# Patient Record
Sex: Female | Born: 1985 | Race: White | Hispanic: No | Smoking: Never smoker
Health system: Southern US, Community
[De-identification: ages and names within clinical notes are randomized; demographics above are authoritative.]

## PROBLEM LIST (undated history)

## (undated) DIAGNOSIS — E119 Type 2 diabetes mellitus without complications: Secondary | ICD-10-CM

## (undated) DIAGNOSIS — I10 Essential (primary) hypertension: Secondary | ICD-10-CM

## (undated) DIAGNOSIS — O139 Gestational [pregnancy-induced] hypertension without significant proteinuria, unspecified trimester: Secondary | ICD-10-CM

## (undated) HISTORY — PX: EYE SURGERY: SHX253

## (undated) HISTORY — PX: CERVICAL POLYPECTOMY: SHX88

---
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
## 2008-09-01 ENCOUNTER — Emergency Department (HOSPITAL_COMMUNITY): Admission: EM | Admit: 2008-09-01 | Discharge: 2008-09-01 | Payer: Self-pay | Admitting: Emergency Medicine

## 2010-12-06 LAB — WET PREP, GENITAL: Yeast Wet Prep HPF POC: NONE SEEN

## 2010-12-06 LAB — CBC
HCT: 40.4 % (ref 36.0–46.0)
Hemoglobin: 14.1 g/dL (ref 12.0–15.0)
MCHC: 34.9 g/dL (ref 30.0–36.0)
MCV: 93.8 fL (ref 78.0–100.0)
RBC: 4.31 MIL/uL (ref 3.87–5.11)

## 2010-12-06 LAB — DIFFERENTIAL
Basophils Relative: 0 % (ref 0–1)
Eosinophils Absolute: 0.2 10*3/uL (ref 0.0–0.7)
Eosinophils Relative: 2 % (ref 0–5)
Monocytes Absolute: 0.4 10*3/uL (ref 0.1–1.0)
Monocytes Relative: 5 % (ref 3–12)
Neutro Abs: 6.9 10*3/uL (ref 1.7–7.7)

## 2010-12-06 LAB — POCT I-STAT, CHEM 8
Chloride: 103 mEq/L (ref 96–112)
Glucose, Bld: 120 mg/dL — ABNORMAL HIGH (ref 70–99)
HCT: 43 % (ref 36.0–46.0)
Potassium: 4 mEq/L (ref 3.5–5.1)

## 2010-12-06 LAB — POCT PREGNANCY, URINE: Preg Test, Ur: POSITIVE

## 2010-12-06 LAB — URINALYSIS, ROUTINE W REFLEX MICROSCOPIC
Glucose, UA: NEGATIVE mg/dL
Hgb urine dipstick: NEGATIVE
Protein, ur: NEGATIVE mg/dL
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
## 2011-01-07 NOTE — Discharge Summary (Signed)
[facility]  Patient:    Natasha Khan, Natasha Khan                        MRN: [id number] Adm. Date:  [id number] Disc. Date: [id number] Attending:  Veneta Penton                           Discharge Summary  REASON FOR ADMISSION:  This 25 year old white female was admitted complaining of suicidal ideation with plan to overdose on aspirin.  For further history of present illness, please see the patients psychiatric admission assessment.  PHYSICAL EXAMINATION:  Her physical examination at the time of admission was significant for her being overweight and was otherwise remarkable for a history of exercise induced asthma.  LABORATORY EXAMINATION:  A urine drug screen was negative.  Urine probe for gonorrhea and chlamydia are pending at the time of discharge.  A urine drug screen was negative.  A urine pregnancy test was negative.  Urinalysis was unremarkable.  Hepatic panel was within normal limits.  Metabolic panel was within normal limits.  A CBC showed an MCHC of 35 and was otherwise unremarkable.  Thyroid function tests were within normal limits and an RPR was nonreactive.  The patient received no special procedures, x-rays or additional consultations and she sustained no complications during her hospital course.  HOSPITAL COURSE:  On admission, the patient as depressed, irritable, angry, hostile and suicidal.  She was continued on a trial of Prozac at 20 mg p.o. q.d. and tolerated this medication well without side effects.  Over the course of this hospitalization, she has shown significant improvement in her affect and mood.  For the past 24 hours she has denied any homicidal or suicidal ideation, has plans for the future, has been participating in all aspects of the therapeutic treatment program and reports feeling encouraged and ready to begin outpatient treatment.  Consequently, it is felt she has reached her maximum benefit of hospitalization and is  ready for discharge to a less restrictive alternative setting.  CONDITION ON DISCHARGE:  Improved.  DSM-IV DISCHARGE DIAGNOSES: Axis I:    1. Major depression, recurrent type severe without psychosis.            2. Cannabis abuse.            3. Oppositional defiant disorder.            4. Rule out post-traumatic stress disorder. Axis II:   None. Axis III:  Allergic rhinitis. Axis IV:   Current psychosocial stressors are severe. Axis V:    Code 10 on admission, code 30 on discharge.  FURTHER EVALUATION AND TREATMENT RECOMMENDATIONS: 1. The patient is discharged to home. 2. The patient is discharged on Prozac 20 mg p.o. q.d., Allegra as instructed    by her pediatrician for allergic rhinitis. 3. The patient is discharged on an unrestricted level of activity and a    regular diet. 4. She is discharged to the custody of her parents. 5. She will follow up at [facility] with her outpatient    therapist for individual and family therapy as well as following up with    the psychiatrist there for medication management.  As she will follow up    with her outpatient mental health professionals at [facility] for all further aspects of her mental health care, I will sign off  on the case at this time. DD:  06/30/00 TD:  06/30/00 Job: [id number]

## 2015-11-27 ENCOUNTER — Other Ambulatory Visit: Payer: Self-pay | Admitting: Family Medicine

## 2015-11-27 DIAGNOSIS — R102 Pelvic and perineal pain: Secondary | ICD-10-CM

## 2015-12-09 ENCOUNTER — Ambulatory Visit
Admission: RE | Admit: 2015-12-09 | Discharge: 2015-12-09 | Disposition: A | Discharge status: Home / Self Care | Payer: BLUE CROSS/BLUE SHIELD | Source: Ambulatory Visit | Attending: Family Medicine | Admitting: Family Medicine

## 2015-12-09 DIAGNOSIS — R102 Pelvic and perineal pain: Secondary | ICD-10-CM

## 2018-01-10 ENCOUNTER — Telehealth: Payer: Self-pay | Admitting: Hematology and Oncology

## 2018-01-10 ENCOUNTER — Encounter: Payer: Self-pay | Admitting: Hematology and Oncology

## 2018-01-10 NOTE — Telephone Encounter (Signed)
Referral received from Dr. Cherly Hensen at [facility] for dx of positive anticardiolipin antibody. Pt has been scheduled to see DR. Gudena on 6/18 at 345pm because she needed the latest hematology appt since she works in [place]. Pt aware to arrive 30 minutes early. Letter mailed.

## 2018-02-06 ENCOUNTER — Inpatient Hospital Stay: Payer: BLUE CROSS/BLUE SHIELD | Attending: Hematology and Oncology | Admitting: Hematology and Oncology

## 2018-02-06 DIAGNOSIS — E7212 Methylenetetrahydrofolate reductase deficiency: Secondary | ICD-10-CM | POA: Diagnosis not present

## 2018-02-06 DIAGNOSIS — Z1589 Genetic susceptibility to other disease: Secondary | ICD-10-CM

## 2018-02-06 NOTE — Assessment & Plan Note (Signed)
Patient has had multiple pregnancy losses. OB/GYN performed extensive work-up for thrombophilia. 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  Counseling: I discussed with the patient how empty HFR gene converts homocystine into methionine.  Lack of this enzyme could lead to hyper homocystinemia which can subsequently raise the risk of thromboembolic disease and pregnancy losses. However multiple studies have shown that heterozygous MTHFR gene mutation does not appear to increase hyper homocystinemia and therefore is not considered to be a thrombophilia. In fact 30% of the population can have a heterozygous mutation.  Multiple studies including a meta-analysis published in 2006 suggested that heterozygous MTHFR gene mutation has not been linked to a pregnancy related complications which included preeclampsia or pregnancy losses or spina bifida.  Based on this finding on that study, I do not recommend any special interventions and I do not believe the MTHFR gene mutation is the cause of patient's pregnancy losses. On the remainder of the blood work there was only a slight abnormality of an indeterminate level of elevation of lupus anticoagulant.  The functional assay for lupus anticoagulant was negative therefore there is no concern for antiphospholipid antibody syndrome.  There is no intervention that is necessary regarding this aspect.  In summary I do not believe her blood work explains the cause of her recurrent pregnancy losses. I instructed the patient to call us if she has any other questions or concerns. Thank you very much for allowing us to participate in her care.

## 2018-02-06 NOTE — Progress Notes (Signed)
[facility]  Patient Care Team: Richmond CampbellKaplan, Kristen W., PA-C as PCP - General (Family Medicine)  CHIEF COMPLAINTS/PURPOSE OF CONSULTATION:  MTHFR gene mutation  HISTORY OF PRESENTING ILLNESS:  Natasha MuttonRachael Khan 32 y.o. 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  She had prior history of multiple pregnancy losses and as part of the work-up she had hypercoagulability testing performed.  She was referred to us because they were a couple of abnormalities that raised concerns.  This included a heterozygous 677C MTHFR gene mutation as well as an indeterminate finding on the lupus anticoagulant testing.  I reviewed her records extensively and collaborated the history with the patient.  MEDICAL HISTORY:  Multiple pregnancy losses SURGICAL HISTORY: No prior surgeries SOCIAL HISTORY: Denies any tobacco alcohol or recreational drug use FAMILY HISTORY: No family history of cancers ALLERGIES:  has no allergies on file.  MEDICATIONS: Prenatal vitamins REVIEW OF SYSTEMS:   Constitutional: Denies fevers, chills or abnormal night sweats Eyes: Denies blurriness of vision, double vision or watery eyes Ears, nose, mouth, throat, and face: Denies mucositis or sore throat Respiratory: Denies cough, dyspnea or wheezes Cardiovascular: Denies palpitation, chest discomfort or lower extremity swelling Gastrointestinal:  Denies nausea, heartburn or change in bowel habits Skin: Denies abnormal skin rashes Lymphatics: Denies new lymphadenopathy or easy bruising Neurological:Denies numbness, tingling or new weaknesses Behavioral/Psych: Mood is stable, no new changes  All other systems were reviewed with the patient and are negative.  PHYSICAL EXAMINATION: ECOG PERFORMANCE STATUS: 0 - Asymptomatic  Vitals:   02/06/18 1535  BP: 120/78  Pulse: 79  Resp: 20  Temp: 98.5 F (36.9 C)  SpO2: 98%   Filed Weights   02/06/18 1535  Weight: 178 lb  11.2 oz (81.1 kg)    GENERAL:alert, no distress and comfortable SKIN: skin color, texture, turgor are normal, no rashes or significant lesions EYES: normal, conjunctiva are pink and non-injected, sclera clear OROPHARYNX:no exudate, no erythema and lips, buccal mucosa, and tongue normal  NECK: supple, thyroid normal size, non-tender, without nodularity LYMPH:  no palpable lymphadenopathy in the cervical, axillary or inguinal LUNGS: clear to auscultation and percussion with normal breathing effort HEART: regular rate & rhythm and no murmurs and no lower extremity edema ABDOMEN:abdomen soft, non-tender and normal bowel sounds Musculoskeletal:no cyanosis of digits and no clubbing  PSYCH: alert & oriented x 3 with fluent speech NEURO: no focal motor/sensory deficits  LABORATORY DATA:  I have reviewed the data as listed Lab Results  Component Value Date   WBC 9.4 09/01/2008   HGB 14.6 09/01/2008   HCT 43.0 09/01/2008   MCV 93.8 09/01/2008   PLT 259 09/01/2008   Lab Results  Component Value Date   NA 141 09/01/2008   K 4.0 09/01/2008   CL 103 09/01/2008    RADIOGRAPHIC STUDIES: I have personally reviewed the radiological reports and agreed with the findings in the report.  ASSESSMENT AND PLAN:  Heterozygous for MTHFR gene mutation [facility](HCC) Patient has had multiple pregnancy losses. OB/GYN performed extensive work-up for thrombophilia. 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  Counseling: I discussed with the patient how empty HFR gene converts homocystine into methionine.  Lack of this enzyme could lead to hyper homocystinemia which can subsequently raise the risk of thromboembolic disease and pregnancy losses. However multiple studies have shown that heterozygous MTHFR gene mutation does not appear to increase hyper homocystinemia and therefore is not considered to  be a thrombophilia. In fact 30% of the population can have a heterozygous  mutation.  Multiple studies including a meta-analysis published in 2006 suggested that heterozygous MTHFR gene mutation has not been linked to a pregnancy related complications which included preeclampsia or pregnancy losses or spina bifida.  Recommendation: Based on this finding on that study, I do not recommend any special interventions and I do not believe the MTHFR gene mutation is the cause of patient's pregnancy losses.  On the remainder of the blood work there was only a slight abnormality of an indeterminate level of elevation of lupus anticoagulant.  The functional assay for lupus anticoagulant was negative therefore there is no concern for antiphospholipid antibody syndrome. There is no intervention that is necessary regarding this aspect.  In summary I do not believe her blood work explains the cause of her recurrent pregnancy losses. I instructed the patient to call us if she has any other questions or concerns. Thank you very much for allowing [place] to participate in her care.   All questions were answered. The patient knows to call the clinic with any problems, questions or concerns.    Tamsen Meek, MD 02/06/18

## 2020-08-22 NOTE — L&D Delivery Note (Signed)
Delivery Note At 9:51 PM a viable female was delivered via Vaginal, Spontaneous (Presentation:   Occiput Anterior).  APGAR: 7, 9; weight  .pending   Placenta status: Spontaneous, Intact.  Cord: 3 vessels with the following complications: None.  Cord pH: 7.2  During pushing FH to 60-70, pt encouraged to push over 2 contractions and delivery of head, nuchal x 1 reduced, shoulders followed easily. Cord clamped and cut at about 30 seconds for poor tone, FH, no cry. NICU in attendance and baby with cry at warmer. Cord pH sent.   Anesthesia: Epidural Episiotomy: None Lacerations: 1st degree R anterior/ sulcal ;2nd degree L inferior sulcal Suture Repair: 3.0 vicryl rapide Est. Blood Loss (mL): 250  Mom to postpartum.  Baby to Couplet care / Skin to Skin.  Lendon Colonel 07/27/2021, 10:30 PM

## 2021-01-12 LAB — OB RESULTS CONSOLE ABO/RH: RH Type: POSITIVE

## 2021-01-12 LAB — OB RESULTS CONSOLE GC/CHLAMYDIA
Chlamydia: NEGATIVE
Gonorrhea: NEGATIVE

## 2021-01-12 LAB — OB RESULTS CONSOLE RPR: RPR: NONREACTIVE

## 2021-01-12 LAB — OB RESULTS CONSOLE HIV ANTIBODY (ROUTINE TESTING): HIV: NONREACTIVE

## 2021-01-12 LAB — OB RESULTS CONSOLE HEPATITIS B SURFACE ANTIGEN: Hepatitis B Surface Ag: NEGATIVE

## 2021-01-12 LAB — OB RESULTS CONSOLE RUBELLA ANTIBODY, IGM: Rubella: NON-IMMUNE/NOT IMMUNE

## 2021-01-12 LAB — OB RESULTS CONSOLE ANTIBODY SCREEN: Antibody Screen: NEGATIVE

## 2021-02-04 ENCOUNTER — Other Ambulatory Visit: Payer: Self-pay | Admitting: Obstetrics and Gynecology

## 2021-02-04 DIAGNOSIS — N611 Abscess of the breast and nipple: Secondary | ICD-10-CM

## 2021-02-05 ENCOUNTER — Ambulatory Visit
Admission: RE | Admit: 2021-02-05 | Discharge: 2021-02-05 | Disposition: A | Discharge status: Home / Self Care | Payer: BC Managed Care – PPO | Source: Ambulatory Visit | Attending: Obstetrics and Gynecology | Admitting: Obstetrics and Gynecology

## 2021-02-05 ENCOUNTER — Ambulatory Visit
Admission: RE | Admit: 2021-02-05 | Discharge: 2021-02-05 | Disposition: A | Discharge status: Home / Self Care | Payer: BLUE CROSS/BLUE SHIELD | Source: Ambulatory Visit | Attending: Obstetrics and Gynecology | Admitting: Obstetrics and Gynecology

## 2021-02-05 ENCOUNTER — Other Ambulatory Visit: Payer: Self-pay

## 2021-02-05 DIAGNOSIS — N611 Abscess of the breast and nipple: Secondary | ICD-10-CM

## 2021-04-07 ENCOUNTER — Telehealth: Payer: Self-pay | Admitting: Hematology and Oncology

## 2021-04-07 NOTE — Telephone Encounter (Signed)
Received a new hem referral from Dr. Ernestina Penna for Methylenetetrahydrofolate reductase deficiency. Natasha Khan has been cld and scheduled to see Dr. Pamelia Hoit on 8/30 at 345pm. Pt aware to arrive 15 minutes early.

## 2021-04-19 NOTE — Progress Notes (Signed)
[facility] CONSULT NOTE  Patient Care Team: Richmond Campbell., PA-C as PCP - General (Family Medicine)  CHIEF COMPLAINTS/PURPOSE OF CONSULTATION:  Newly diagnosed methylenetetrahydrofolate reductase deficiency  HISTORY OF PRESENTING ILLNESS:  Natasha Khan 35 y.o. female is here because of recent diagnosis of methylenetetrahydrofolate reductase deficiency. She presents to the clinic today for initial evaluation and discussion of treatment options.  She is 6 months pregnant and is very concerned about pregnancy losses because she had 6 pregnancy losses and has extensive family history of blood clots.  She was started on heparin by her high risk OB and there was a disagreement between whether or not she needs anticoagulation or not and therefore we were asked to consult.  I reviewed her records extensively and collaborated the history with the patient.  MEDICAL HISTORY:  Multiple pregnancy losses  SURGICAL HISTORY: No prior surgeries  SOCIAL HISTORY: Denies any tobacco or alcohol recreational drug use  FAMILY HISTORY: Extensive family members with blood clots and thromboembolic events    ALLERGIES:  has no allergies on file.  MEDICATIONS:  Current Outpatient Medications  Medication Sig Dispense Refill   busPIRone (BUSPAR) 7.5 MG tablet Take 1 tablet (7.5 mg total) by mouth 2 (two) times daily.     enoxaparin (LOVENOX) 150 MG/ML injection Inject 1 mL (150 mg total) into the skin daily. 30 mL 3   B Complex-C-Folic Acid (HM SUPER VITAMIN B COMPLEX/C PO) Take by mouth.     labetalol (NORMODYNE) 300 MG tablet Take 1 tablet (300 mg total) by mouth 3 (three) times daily.     loratadine (CLARITIN) 10 MG tablet Take 10 mg by mouth daily.     Prenatal Vit-Fe Fumarate-FA (MULTIVITAMIN-PRENATAL) 27-0.8 MG TABS tablet Take 1 tablet by mouth daily at 12 noon.     No current facility-administered medications for this visit.    REVIEW OF SYSTEMS:   Constitutional: Denies  fevers, chills or abnormal night sweats Pleasant lady no acute distress All other systems were reviewed with the patient and are negative.  PHYSICAL EXAMINATION: ECOG PERFORMANCE STATUS: 1 - Symptomatic but completely ambulatory  Vitals:   04/20/21 1548  BP: (!) 131/57  Pulse: 83  Resp: 16  Temp: (!) 97.5 F (36.4 C)  SpO2: 95%   Filed Weights   04/20/21 1548  Weight: 232 lb 3.2 oz (105.3 kg)       LABORATORY DATA:  I have reviewed the data as listed Lab Results  Component Value Date   WBC 9.4 09/01/2008   HGB 14.6 09/01/2008   HCT 43.0 09/01/2008   MCV 93.8 09/01/2008   PLT 259 09/01/2008   Lab Results  Component Value Date   NA 141 09/01/2008   K 4.0 09/01/2008   CL 103 09/01/2008    RADIOGRAPHIC STUDIES: I have personally reviewed the radiological reports and agreed with the findings in the report.  ASSESSMENT AND PLAN:  Heterozygous for MTHFR gene mutation (HCC) I discussed with the patient that the MTHFR gene mutation leads to hyper homocystinemia which can increase the risk of blood clots. MTHFR gene helps in the size of protein that enables conversion of homocysteine to methionine. This enzyme reaction is catalyzed in the presence of B complex vitamins and folic acid. In the [place], the percent of the population is heterozygous for this gene and 10% of homozygous. Only the homozygous variant carrying patients have hyper homocystinemia.  Heterozygous MTHFR gene mutation without associated hyper homocystinemia is not a risk factor  for thrombosis and usually does not require any anticoagulation.  However given the patient's history of 6 pregnancy losses and extensive family history of thromboembolic disease, there appears to be some factor that may be interfering with the coagulation pathway and therefore I recommend anticoagulation with Lovenox.  Plan:  1. Lovenox 1.5 mg/kg once daily (150 mg total dose) 2. Anti-X A level to be drawn 4 hours after  the injection in 1 week Telephone visit after that to discuss adjustment of the dose 3.  During the last week of pregnancy, she will switch back to heparin. 4.  Postpartum she will take Lovenox for 6 more weeks.   PAI deficiency: Plasminogen activator inhibitor-1 deficiency leads to excessive fibrinolysis with the potential for bleeding as a complication. Heterozygous individuals carrying a single gene do not exhibit abnormal bleeding Homozygous individuals can experience more bleeding after surgery or trauma.  Obstetric complications including preterm labor and miscarriage can also happen. If patients have excessive bleeding then, tranexamic acid or aminocaproic acid can be given postoperatively   All questions were answered. The patient knows to call the clinic with any problems, questions or concerns.   Sabas Sous, MD, MPH 04/20/2021    I, Alda Ponder, am acting as scribe for Serena Croissant, MD.  I have reviewed the above documentation for accuracy and completeness, and I agree with the above.

## 2021-04-20 ENCOUNTER — Other Ambulatory Visit: Payer: Self-pay

## 2021-04-20 ENCOUNTER — Inpatient Hospital Stay: Payer: BC Managed Care – PPO | Attending: Hematology and Oncology | Admitting: Hematology and Oncology

## 2021-04-20 DIAGNOSIS — Z7901 Long term (current) use of anticoagulants: Secondary | ICD-10-CM | POA: Diagnosis not present

## 2021-04-20 DIAGNOSIS — Z79899 Other long term (current) drug therapy: Secondary | ICD-10-CM | POA: Insufficient documentation

## 2021-04-20 DIAGNOSIS — E7212 Methylenetetrahydrofolate reductase deficiency: Secondary | ICD-10-CM | POA: Diagnosis not present

## 2021-04-20 DIAGNOSIS — Z1589 Genetic susceptibility to other disease: Secondary | ICD-10-CM

## 2021-04-20 MED ORDER — BUSPIRONE HCL 7.5 MG PO TABS: 7.5000 mg | ORAL_TABLET | Freq: Two times a day (BID) | ORAL | Status: AC

## 2021-04-20 MED ORDER — LABETALOL HCL 300 MG PO TABS: 300.0000 mg | ORAL_TABLET | Freq: Three times a day (TID) | ORAL | Status: DC

## 2021-04-20 MED ORDER — ENOXAPARIN SODIUM 150 MG/ML IJ SOSY: 150.0000 mg | PREFILLED_SYRINGE | INTRAMUSCULAR | 3 refills | Status: DC

## 2021-04-20 NOTE — Assessment & Plan Note (Addendum)
I discussed with the patient that the MTHFR gene mutation leads to hyper homocystinemia which can increase the risk of blood clots. MTHFR gene helps in the size of protein that enables conversion of homocysteine to methionine. This enzyme reaction is catalyzed in the presence of B complex vitamins and folic acid. In the [place], the percent of the population is heterozygous for this gene and 10% of homozygous. Only the homozygous variant carrying patients have hyper homocystinemia.  Heterozygous MTHFR gene mutation without associated hyper homocystinemia is not a risk factor for thrombosis and usually does not require any anticoagulation.  However given the patient's history of 6 pregnancy losses and extensive family history of thromboembolic disease, there appears to be some factor that may be interfering with the coagulation pathway and therefore I recommend anticoagulation with Lovenox.  Plan:  1. Lovenox 1.5 mg/kg once daily (150 mg total dose) 2. Anti-X A level to be drawn 4 hours after the injection in 1 week Telephone visit after that to discuss adjustment of the dose 3.  During the last week of pregnancy, she will switch back to heparin. 4.  Postpartum she will take Lovenox for 6 more weeks.   PAI deficiency: Plasminogen activator inhibitor-1 deficiency leads to excessive fibrinolysis with the potential for bleeding as a complication. Heterozygous individuals carrying a single gene do not exhibit abnormal bleeding Homozygous individuals can experience more bleeding after surgery or trauma.  Obstetric complications including preterm labor and miscarriage can also happen. If patients have excessive bleeding then, tranexamic acid or aminocaproic acid can be given postoperatively

## 2021-04-27 ENCOUNTER — Inpatient Hospital Stay: Payer: BC Managed Care – PPO | Attending: Hematology and Oncology

## 2021-04-27 ENCOUNTER — Other Ambulatory Visit: Payer: Self-pay

## 2021-04-27 DIAGNOSIS — Z1589 Genetic susceptibility to other disease: Secondary | ICD-10-CM

## 2021-04-27 DIAGNOSIS — E7212 Methylenetetrahydrofolate reductase deficiency: Secondary | ICD-10-CM | POA: Diagnosis not present

## 2021-04-27 LAB — HEPARIN ANTI-XA: Heparin LMW: 0.74 IU/mL

## 2021-04-27 NOTE — Progress Notes (Signed)
  HEMATOLOGY-ONCOLOGY TELEPHONE VISIT PROGRESS NOTE  I connected with Natasha Khan on 04/28/2021 at 10:45 AM EDT by telephone and verified that I am speaking with the correct person using two identifiers.  I discussed the limitations, risks, security and privacy concerns of performing an evaluation and management service by telephone and the availability of in person appointments.  I also discussed with the patient that there may be a patient responsible charge related to this service. The patient expressed understanding and agreed to proceed.   History of Present Illness: Natasha Khan is a 35 y.o. 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 She presents via telephone today for follow-up.  She has been tolerating the injections reasonably well.  She does cause injection site discomfort with slight irritation and itching but she has been able to manage it.   Observations/Objective:     Assessment Plan:  Heterozygous for MTHFR gene mutation (HCC) Heterozygous MTHFR gene mutation without associated hyper homocystinemia is not a risk factor for thrombosis and usually does not require any anticoagulation.   However given the patient's history of 6 pregnancy losses and extensive family history of thromboembolic disease, there appears to be some factor that may be interfering with the coagulation pathway and therefore I recommend anticoagulation with Lovenox.   Plan:  1. Lovenox 1.5 mg/kg once daily (150 mg total dose) 2. Anti-X A level  3.  During the last week of pregnancy, she will switch back to heparin. 4.  Postpartum she will take Lovenox for 6 more weeks.  Lab review: Antiten X A level: 0.74: This is below the threshold for adequate anticoagulation.  Ideally we would like to increase the dosage to 180 mg but there is no 180 mg syringe available.  Since its only been a week on the treatment we decided to watch it another week and then make a  decision.  The other issue is also the cost of the drug.  It is about $10 a vial and there are no other options for her.   I discussed the assessment and treatment plan with the patient. The patient was provided an opportunity to ask questions and all were answered. The patient agreed with the plan and demonstrated an understanding of the instructions. The patient was advised to call back or seek an in-person evaluation if the symptoms worsen or if the condition fails to improve as anticipated.   Total time spent: 12 mins including non-face to face time and time spent for planning, charting and coordination of care  Sabas Sous, MD 04/28/2021    I, Alda Ponder, am acting as scribe for Serena Croissant, MD.  I have reviewed the above documentation for accuracy and completeness, and I agree with the above.

## 2021-04-28 ENCOUNTER — Inpatient Hospital Stay (HOSPITAL_BASED_OUTPATIENT_CLINIC_OR_DEPARTMENT_OTHER): Payer: BC Managed Care – PPO | Admitting: Hematology and Oncology

## 2021-04-28 DIAGNOSIS — E7212 Methylenetetrahydrofolate reductase deficiency: Secondary | ICD-10-CM

## 2021-04-28 DIAGNOSIS — Z1589 Genetic susceptibility to other disease: Secondary | ICD-10-CM | POA: Diagnosis not present

## 2021-04-28 NOTE — Assessment & Plan Note (Signed)
Heterozygous MTHFR gene mutation without associated hyper homocystinemia is not a risk factor for thrombosis and usually does not require any anticoagulation.  However given the patient's history of 6 pregnancy losses and extensive family history of thromboembolic disease, there appears to be some factor that may be interfering with the coagulation pathway and therefore I recommend anticoagulation with Lovenox.  Plan:  1. Lovenox 1.5 mg/kg once daily (150 mg total dose) 2. Anti-X A level  3.  During the last week of pregnancy, she will switch back to heparin. 4.  Postpartum she will take Lovenox for 6 more weeks.

## 2021-04-30 ENCOUNTER — Telehealth: Payer: Self-pay | Admitting: Hematology and Oncology

## 2021-04-30 NOTE — Telephone Encounter (Signed)
Scheduled appt per 9/7 los - patient is aware of appt date and time.  

## 2021-05-05 ENCOUNTER — Other Ambulatory Visit: Payer: Self-pay

## 2021-05-05 ENCOUNTER — Inpatient Hospital Stay: Payer: BC Managed Care – PPO

## 2021-05-05 DIAGNOSIS — Z1589 Genetic susceptibility to other disease: Secondary | ICD-10-CM

## 2021-05-05 DIAGNOSIS — E7212 Methylenetetrahydrofolate reductase deficiency: Secondary | ICD-10-CM | POA: Diagnosis not present

## 2021-05-05 LAB — HEPARIN ANTI-XA: Heparin LMW: 0.61 IU/mL

## 2021-05-05 NOTE — Progress Notes (Signed)
HEMATOLOGY-ONCOLOGY TELEPHONE VISIT PROGRESS NOTE  I connected with Natasha Khan on 05/06/21 at  8:45 AM EDT by telephone and verified that I am speaking with the correct person using two identifiers.  I discussed the limitations, risks, security and privacy concerns of performing an evaluation and management service by telephone and the availability of in person appointments.  I also discussed with the patient that there may be a patient responsible charge related to this service. The patient expressed understanding and agreed to proceed.   History of Present Illness: Follow-up to discuss anticoagulation plan Natasha Khan is currently [redacted] weeks pregnant and had a prior history of pregnancy losses and extensive family history of thromboembolic disease.  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  She was started on therapeutic Lovenox dosing with the concern for thromboembolic disease but today we are discussing whether that approach is necessary.   Assessment Plan:  Heterozygous for MTHFR gene mutation (HCC) Heterozygous MTHFR gene mutation without associated hyper homocystinemia is not a risk factor for thrombosis and usually does not require any anticoagulation.   However given the patient's history of 6 pregnancy losses and extensive family history of thromboembolic disease, there appears to be some factor that may be interfering with the coagulation pathway and therefore I recommend anticoagulation with Lovenox. Current: 27 weeks      Lab review:  Antiten X A level:   04/27/2021: 0.74 05/05/2021: 0.61  Counseling: We had lengthy discussion about the pros and cons of therapeutic Lovenox versus prophylactic Lovenox dosing. Given the fact that she has both MTHFR gene mutation as well as the PAI-1 inhibitor deficiency, it is reasonable to go to the prophylactic dosage of Lovenox instead. She will discuss this with Dr. Algie Coffer. The prophylactic dose  will be 40 mg subcu daily. During the last 2 weeks of pregnancy she will need to switch to heparin subcu 5000 international units Postpartum for 6 weeks she will need to stay on Lovenox once her bleeding is controlled.  Patient asked me to send a message Dr. Algie Coffer to prescribe the Lovenox. We will see her on an as-needed basis.     I discussed the assessment and treatment plan with the patient. The patient was provided an opportunity to ask questions and all were answered. The patient agreed with the plan and demonstrated an understanding of the instructions. The patient was advised to call back or seek an in-person evaluation if the symptoms worsen or if the condition fails to improve as anticipated.   I provided 12 minutes of non-face-to-face time during this encounter. Tamsen Meek, MD

## 2021-05-06 ENCOUNTER — Ambulatory Visit (HOSPITAL_BASED_OUTPATIENT_CLINIC_OR_DEPARTMENT_OTHER): Payer: BC Managed Care – PPO | Admitting: Hematology and Oncology

## 2021-05-06 DIAGNOSIS — E7212 Methylenetetrahydrofolate reductase deficiency: Secondary | ICD-10-CM | POA: Diagnosis not present

## 2021-05-06 DIAGNOSIS — Z1589 Genetic susceptibility to other disease: Secondary | ICD-10-CM

## 2021-05-06 NOTE — Assessment & Plan Note (Addendum)
Heterozygous MTHFR gene mutation without associated hyper homocystinemia is not a risk factor for thrombosis andusuallydoes not require any anticoagulation.  However given the patient's history of 6 pregnancy lossesand extensive family history of thromboembolic disease,there appears to be some factor that may be interfering with the coagulation pathway and therefore I recommend anticoagulation with Lovenox. Current: 27 weeks    Lab review:  Antiten X A level:   04/27/2021: 0.74 05/05/2021: 0.61  Counseling: We had lengthy discussion about the pros and cons of therapeutic Lovenox versus prophylactic Lovenox dosing. Given the fact that she has both MTHFR gene mutation as well as the PAI-1 inhibitor deficiency, it is reasonable to go to the prophylactic dosage of Lovenox instead. She will discuss this with Dr. Algie Coffer. The prophylactic dose will be 40 mg subcu daily. During the last 2 weeks of pregnancy she will need to switch to heparin subcu 5000 international units Postpartum for 6 weeks she will need to stay on Lovenox once her bleeding is controlled.  Patient asked me to send a message Dr. Algie Coffer to prescribe the Lovenox. We will see her on an as-needed basis.

## 2021-05-14 ENCOUNTER — Encounter: Payer: Self-pay | Admitting: Hematology and Oncology

## 2021-07-12 LAB — OB RESULTS CONSOLE GBS: GBS: NEGATIVE

## 2021-07-23 ENCOUNTER — Other Ambulatory Visit: Payer: Self-pay | Admitting: Obstetrics

## 2021-07-26 ENCOUNTER — Other Ambulatory Visit: Payer: Self-pay

## 2021-07-27 ENCOUNTER — Inpatient Hospital Stay (HOSPITAL_COMMUNITY): Payer: BC Managed Care – PPO | Admitting: Anesthesiology

## 2021-07-27 ENCOUNTER — Encounter (HOSPITAL_COMMUNITY): Payer: Self-pay | Admitting: Obstetrics

## 2021-07-27 ENCOUNTER — Inpatient Hospital Stay (HOSPITAL_COMMUNITY): Payer: BC Managed Care – PPO

## 2021-07-27 ENCOUNTER — Inpatient Hospital Stay (HOSPITAL_COMMUNITY)
Admission: AD | Admit: 2021-07-27 | Discharge: 2021-07-29 | DRG: 806 | Disposition: A | Discharge status: Home / Self Care | Payer: BC Managed Care – PPO | Attending: Obstetrics | Admitting: Obstetrics

## 2021-07-27 DIAGNOSIS — O99284 Endocrine, nutritional and metabolic diseases complicating childbirth: Secondary | ICD-10-CM | POA: Diagnosis present

## 2021-07-27 DIAGNOSIS — O24424 Gestational diabetes mellitus in childbirth, insulin controlled: Secondary | ICD-10-CM | POA: Diagnosis present

## 2021-07-27 DIAGNOSIS — O1002 Pre-existing essential hypertension complicating childbirth: Secondary | ICD-10-CM | POA: Diagnosis present

## 2021-07-27 DIAGNOSIS — O99344 Other mental disorders complicating childbirth: Secondary | ICD-10-CM | POA: Diagnosis present

## 2021-07-27 DIAGNOSIS — Z3A38 38 weeks gestation of pregnancy: Secondary | ICD-10-CM

## 2021-07-27 DIAGNOSIS — Z3490 Encounter for supervision of normal pregnancy, unspecified, unspecified trimester: Secondary | ICD-10-CM | POA: Diagnosis present

## 2021-07-27 DIAGNOSIS — O99214 Obesity complicating childbirth: Secondary | ICD-10-CM | POA: Diagnosis present

## 2021-07-27 DIAGNOSIS — Q059 Spina bifida, unspecified: Secondary | ICD-10-CM

## 2021-07-27 DIAGNOSIS — Z20822 Contact with and (suspected) exposure to covid-19: Secondary | ICD-10-CM | POA: Diagnosis present

## 2021-07-27 DIAGNOSIS — F419 Anxiety disorder, unspecified: Secondary | ICD-10-CM | POA: Diagnosis present

## 2021-07-27 DIAGNOSIS — E7212 Methylenetetrahydrofolate reductase deficiency: Secondary | ICD-10-CM | POA: Diagnosis present

## 2021-07-27 DIAGNOSIS — O24419 Gestational diabetes mellitus in pregnancy, unspecified control: Secondary | ICD-10-CM

## 2021-07-27 DIAGNOSIS — O10919 Unspecified pre-existing hypertension complicating pregnancy, unspecified trimester: Secondary | ICD-10-CM | POA: Diagnosis present

## 2021-07-27 DIAGNOSIS — Z1589 Genetic susceptibility to other disease: Secondary | ICD-10-CM

## 2021-07-27 DIAGNOSIS — O99354 Diseases of the nervous system complicating childbirth: Secondary | ICD-10-CM | POA: Diagnosis present

## 2021-07-27 HISTORY — DX: Essential (primary) hypertension: I10

## 2021-07-27 HISTORY — DX: Gestational (pregnancy-induced) hypertension without significant proteinuria, unspecified trimester: O13.9

## 2021-07-27 HISTORY — DX: Type 2 diabetes mellitus without complications: E11.9

## 2021-07-27 LAB — CBC
HCT: 34.8 % — ABNORMAL LOW (ref 36.0–46.0)
HCT: 36.7 % (ref 36.0–46.0)
Hemoglobin: 11.6 g/dL — ABNORMAL LOW (ref 12.0–15.0)
Hemoglobin: 12.1 g/dL (ref 12.0–15.0)
MCH: 31.2 pg (ref 26.0–34.0)
MCH: 30.6 pg (ref 26.0–34.0)
MCHC: 33.3 g/dL (ref 30.0–36.0)
MCHC: 33 g/dL (ref 30.0–36.0)
MCV: 93.5 fL (ref 80.0–100.0)
MCV: 92.9 fL (ref 80.0–100.0)
Platelets: 363 10*3/uL (ref 150–400)
Platelets: 365 10*3/uL (ref 150–400)
RBC: 3.72 MIL/uL — ABNORMAL LOW (ref 3.87–5.11)
RBC: 3.95 MIL/uL (ref 3.87–5.11)
RDW: 13.4 % (ref 11.5–15.5)
RDW: 13.2 % (ref 11.5–15.5)
WBC: 10.2 10*3/uL (ref 4.0–10.5)
WBC: 17.3 10*3/uL — ABNORMAL HIGH (ref 4.0–10.5)
nRBC: 0 % (ref 0.0–0.2)
nRBC: 0 % (ref 0.0–0.2)

## 2021-07-27 LAB — COMPREHENSIVE METABOLIC PANEL
ALT: 20 U/L (ref 0–44)
AST: 31 U/L (ref 15–41)
Albumin: 2.8 g/dL — ABNORMAL LOW (ref 3.5–5.0)
Alkaline Phosphatase: 72 U/L (ref 38–126)
Anion gap: 8 (ref 5–15)
BUN: 8 mg/dL (ref 6–20)
CO2: 20 mmol/L — ABNORMAL LOW (ref 22–32)
Calcium: 8.9 mg/dL (ref 8.9–10.3)
Chloride: 105 mmol/L (ref 98–111)
Creatinine, Ser: 0.59 mg/dL (ref 0.44–1.00)
GFR, Estimated: >60 mL/min (ref >60)
Glucose, Bld: 95 mg/dL (ref 70–99)
Potassium: 3.9 mmol/L (ref 3.5–5.1)
Sodium: 133 mmol/L — ABNORMAL LOW (ref 135–145)
Total Bilirubin: 0.3 mg/dL (ref 0.3–1.2)
Total Protein: 6.1 g/dL — ABNORMAL LOW (ref 6.5–8.1)

## 2021-07-27 LAB — TYPE AND SCREEN
ABO/RH(D): B POS
Antibody Screen: NEGATIVE

## 2021-07-27 LAB — GLUCOSE, CAPILLARY
Glucose-Capillary: 110 mg/dL — ABNORMAL HIGH (ref 70–99)
Glucose-Capillary: 102 mg/dL — ABNORMAL HIGH (ref 70–99)
Glucose-Capillary: 117 mg/dL — ABNORMAL HIGH (ref 70–99)
Glucose-Capillary: 125 mg/dL — ABNORMAL HIGH (ref 70–99)
Glucose-Capillary: 88 mg/dL (ref 70–99)
Glucose-Capillary: 104 mg/dL — ABNORMAL HIGH (ref 70–99)

## 2021-07-27 LAB — RESP PANEL BY RT-PCR (FLU A&B, COVID) ARPGX2
Influenza A by PCR: NEGATIVE
Influenza B by PCR: NEGATIVE
SARS Coronavirus 2 by RT PCR: NEGATIVE

## 2021-07-27 LAB — PROTEIN / CREATININE RATIO, URINE
Creatinine, Urine: 233.47 mg/dL
Protein Creatinine Ratio: 0.09 mg/mg{Cre} (ref 0.00–0.15)
Total Protein, Urine: 20 mg/dL

## 2021-07-27 LAB — RPR: RPR Ser Ql: NONREACTIVE

## 2021-07-27 LAB — URIC ACID: Uric Acid, Serum: 4.9 mg/dL (ref 2.5–7.1)

## 2021-07-27 MED ORDER — PHENYLEPHRINE 40 MCG/ML (10ML) SYRINGE FOR IV PUSH (FOR BLOOD PRESSURE SUPPORT): 80.0000 ug | PREFILLED_SYRINGE | INTRAVENOUS | Status: DC | PRN

## 2021-07-27 MED ORDER — FENTANYL CITRATE (PF) 100 MCG/2ML IJ SOLN
100.0000 ug | INTRAMUSCULAR | Status: DC | PRN
  Administered 2021-07-27: 100 ug via INTRAVENOUS
  Filled 2021-07-27: qty 2

## 2021-07-27 MED ORDER — ONDANSETRON HCL 4 MG/2ML IJ SOLN
4.0000 mg | Freq: Four times a day (QID) | INTRAMUSCULAR | Status: DC | PRN
  Administered 2021-07-27: 4 mg via INTRAVENOUS
  Filled 2021-07-27: qty 2

## 2021-07-27 MED ORDER — FENTANYL-BUPIVACAINE-NACL 0.5-0.125-0.9 MG/250ML-% EP SOLN: 12.0000 mL/h | EPIDURAL | Status: DC | PRN

## 2021-07-27 MED ORDER — LACTATED RINGERS IV SOLN: 500.0000 mL | INTRAVENOUS | Status: DC | PRN

## 2021-07-27 MED ORDER — DIPHENHYDRAMINE HCL 50 MG/ML IJ SOLN: 12.5000 mg | INTRAMUSCULAR | Status: DC | PRN

## 2021-07-27 MED ORDER — ACETAMINOPHEN 325 MG PO TABS
650.0000 mg | ORAL_TABLET | ORAL | Status: DC | PRN
  Administered 2021-07-27: 650 mg via ORAL
  Filled 2021-07-27: qty 2

## 2021-07-27 MED ORDER — FENTANYL-BUPIVACAINE-NACL 0.5-0.125-0.9 MG/250ML-% EP SOLN
12.0000 mL/h | EPIDURAL | Status: DC | PRN
  Administered 2021-07-27: 12 mL/h via EPIDURAL
  Filled 2021-07-27: qty 250

## 2021-07-27 MED ORDER — LABETALOL HCL 200 MG PO TABS
400.0000 mg | ORAL_TABLET | Freq: Three times a day (TID) | ORAL | Status: DC
  Administered 2021-07-27 (×3): 400 mg via ORAL
  Filled 2021-07-27 (×4): qty 2

## 2021-07-27 MED ORDER — LACTATED RINGERS IV SOLN: 500.0000 mL | Freq: Once | INTRAVENOUS | Status: DC

## 2021-07-27 MED ORDER — OXYTOCIN-SODIUM CHLORIDE 30-0.9 UT/500ML-% IV SOLN
2.5000 [IU]/h | INTRAVENOUS | Status: DC
  Administered 2021-07-27: 2.5 [IU]/h via INTRAVENOUS

## 2021-07-27 MED ORDER — LACTATED RINGERS IV SOLN: INTRAVENOUS | Status: DC

## 2021-07-27 MED ORDER — EPHEDRINE 5 MG/ML INJ: 10.0000 mg | INTRAVENOUS | Status: DC | PRN

## 2021-07-27 MED ORDER — LIDOCAINE HCL (PF) 1 % IJ SOLN
INTRAMUSCULAR | Status: DC | PRN
Start: 2021-07-27 — End: 2021-07-27
  Administered 2021-07-27: 8 mL via EPIDURAL

## 2021-07-27 MED ORDER — MISOPROSTOL 25 MCG QUARTER TABLET
25.0000 ug | ORAL_TABLET | ORAL | Status: AC | PRN
  Administered 2021-07-27 (×2): 25 ug via VAGINAL
  Filled 2021-07-27 (×2): qty 1

## 2021-07-27 MED ORDER — LIDOCAINE HCL (PF) 1 % IJ SOLN: 30.0000 mL | INTRAMUSCULAR | Status: DC | PRN

## 2021-07-27 MED ORDER — SOD CITRATE-CITRIC ACID 500-334 MG/5ML PO SOLN: 30.0000 mL | ORAL | Status: DC | PRN

## 2021-07-27 MED ORDER — OXYTOCIN BOLUS FROM INFUSION
333.0000 mL | Freq: Once | INTRAVENOUS | Status: AC
  Administered 2021-07-27: 333 mL via INTRAVENOUS

## 2021-07-27 MED ORDER — OXYTOCIN-SODIUM CHLORIDE 30-0.9 UT/500ML-% IV SOLN
1.0000 m[IU]/min | INTRAVENOUS | Status: DC
  Administered 2021-07-27: 2 m[IU]/min via INTRAVENOUS
  Filled 2021-07-27: qty 500

## 2021-07-27 MED ORDER — TERBUTALINE SULFATE 1 MG/ML IJ SOLN: 0.2500 mg | Freq: Once | INTRAMUSCULAR | Status: DC | PRN

## 2021-07-27 NOTE — Lactation Note (Signed)
This note was copied from a baby's chart. Lactation Consultation Note Baby latched when LC entered rm. Brief review of BF. Busy in rm. Will f/u MBU.  Patient Name: Natasha Khan Date: 07/27/2021 Reason for consult: L&D Initial assessment;Primapara;Early term 37-38.6wks Age:36 hours  Maternal Data    Feeding    LATCH Score Latch: Grasps breast easily, tongue down, lips flanged, rhythmical sucking.  Audible Swallowing: None  Type of Nipple: Everted at rest and after stimulation  Comfort (Breast/Nipple): Soft / non-tender  Hold (Positioning): Assistance needed to correctly position infant at breast and maintain latch.  LATCH Score: 7   Lactation Tools Discussed/Used    Interventions Interventions: Adjust position;Assisted with latch;Skin to skin  Discharge    Consult Status Consult Status: Follow-up Date: 07/28/21 Follow-up type: In-patient    Charyl Dancer 07/27/2021, 11:34 PM

## 2021-07-27 NOTE — Progress Notes (Signed)
S: Doing well, no complaints, pain well controlled, not yet needing  epidural  O: BP 117/85   Pulse 80   Temp 97.6 F (36.4 C) (Oral)   Resp 18   Ht 5\' 8"  (1.727 m)   Wt 116.1 kg   LMP 10/31/2020 (Approximate)   SpO2 100%   BMI 38.92 kg/m    FHT:  FHR: 140s bpm, variability: moderate,  accelerations:  Present,  decelerations:  Absent UC:   regular, every 3 minutes SVE:   Dilation: 5 Effacement (%): 50 Station: -2 Exam by:: Dr. [email or web address] AROM bloody  A / P:  35 y.o.  OB History  Gravida Para Term Preterm AB Living  3 0 0 0 2 0  SAB IAB Ectopic Multiple Live Births  2 0 0 0 0   at [redacted]w[redacted]d IOL adequate progress, not yet in active labor. AROM now, cont pit Chronic htn GDM, A2  Fetal Wellbeing:  Category I Pain Control:  Labor support without medications  Anticipated MOD:  NSVD  [redacted]w[redacted]d 07/27/2021, 4:40 PM

## 2021-07-27 NOTE — Progress Notes (Signed)
Dr. Ernestina Penna requested CNM to place Foley balloon for cervical ripening.   Discussed the R/B/A of Foley balloon placement with patient and husband. Declines procedure at this time.   Dr. Ernestina Penna notified.   June Leap, MSN, CNM 07/27/2021, 8:57 AM

## 2021-07-27 NOTE — H&P (Addendum)
Natasha Khan is a 35 y.o. G7P0060 at [redacted]w[redacted]d presenting for IOL through heparin window given GDM, PAI-1, chronic hypertension.    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 Early management with REI, early u/s, on heparin 5000 bid through 12 wks. Much discussion over need for thromboprophylaxis and dose changed through pregnancy based on changing recommendations from hematology. On heparin 5000 bid for most of preg. Plan to cont prophylaxis (Lovenox 40 once daily or heparin 5000 bid for 12 wks PP) - First trimester bleeding. Heparin,SCH, cervical polyp removed at 10 wks - chronic htn. Baseline PIH labs with ALT at 36. Started preg at labetalol 200 bid and increased to $RemoveBefo'500mg'AlFRvknEJIa$  tid -obesity. 40# wt gain - GDM. Declined metformin. 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 36 wks. 6'9/ 60% - GBS neg   Prenatal Transfer Tool  Maternal Diabetes: Yes:  Diabetes Type:  Insulin/Medication controlled Genetic Screening: Normal Maternal Ultrasounds/Referrals: Normal Fetal Ultrasounds or other Referrals:  Other:  Maternal Substance Abuse:  No Significant Maternal Medications:  Meds include: Other:  Significant Maternal Lab Results: Group B Strep negative     OB History     Gravida  3   Para      Term      Preterm      AB  2   Living         SAB  2   IAB      Ectopic      Multiple      Live Births             Past Medical History:  Diagnosis Date   Diabetes mellitus without complication ([place])    Hypertension    Pregnancy induced hypertension     Review of Systems - Negative except anxiety, discomfort of pregnancy   Dilation: 1.5 Effacement (%): 50 Station: -3 Exam by:: Thersa Salt, RN Blood pressure (!) 111/55, pulse 90, temperature 98.1 F (36.7 C), temperature source Oral, resp.  rate 16, height $RemoveBe'5\' 8"'TalRYFETx$  (1.727 m), weight 116.1 kg, last menstrual period 10/31/2020, SpO2 100 %.  Physical Exam: will update upon visit  Prenatal labs: ABO, Rh: --/--/B POS (12/06 0204) Antibody: NEG (12/06 0204) Rubella: Nonimmune (05/24 0000) RPR: Nonreactive (05/24 0000)  HBsAg: Negative (05/24 0000)  HIV: Non-reactive (05/24 0000)  GBS: Negative/-- (11/21 0000)  1 hr Glucola 164, abnl 3 hr  Genetic screening nl Panorama Anatomy US normal  CBC    Component Value Date/Time   WBC 10.2 07/27/2021 0204   RBC 3.72 (L) 07/27/2021 0204   HGB 11.6 (L) 07/27/2021 0204   HCT 34.8 (L) 07/27/2021 0204   PLT 363 07/27/2021 0204   MCV 93.5 07/27/2021 0204   MCH 31.2 07/27/2021 0204   MCHC 33.3 07/27/2021 0204   RDW 13.4 07/27/2021 0204   LYMPHSABS 1.8 09/01/2008 1733   MONOABS 0.4 09/01/2008 1733   EOSABS 0.2 09/01/2008 1733   BASOSABS 0.0 09/01/2008 1733   CMP     Component Value Date/Time   NA 133 (L) 07/27/2021 0204   K 3.9 07/27/2021 0204   CL 105 07/27/2021 0204   CO2 20 (L) 07/27/2021 0204   GLUCOSE 95 07/27/2021 0204   BUN 8 07/27/2021 0204   CREATININE 0.59 07/27/2021 0204   CALCIUM 8.9 07/27/2021 0204  PROT 6.1 (L) 07/27/2021 0204   ALBUMIN 2.8 (L) 07/27/2021 0204   AST 31 07/27/2021 0204   ALT 20 07/27/2021 0204   ALKPHOS 72 07/27/2021 0204   BILITOT 0.3 07/27/2021 0204   GFRNONAA >60 07/27/2021 0204      Assessment/Plan: 35 y.o. G3P0020 at [redacted]w[redacted]d IOL for chronic htn at 38+ wk. Cont labetalol 500 tid, no evidence PEC trhough pt at risk given age, obesity, GDM, PAI - heparin window. Last dose 12/5 am. Held dose last night, will resume 24 hr PP - GDM. On Lantus, instructed to take 1/2 dose last night, will follow BS in labor - anasthesia consult for h/o spina bifida - recc MMR PP though pt declined other vaccines in pregnancy - AMA - obesity - GBS neg - IOL. Cytotec x 2 then pit 2x2, consider cervical foley.   Floyce Stakes Arther Heisler 07/27/2021 8:16 AM      Ala Dach 07/27/2021, 8:06 AM   H&P updated Pt s/p cytotec x 2, cervical foley and now on pitocin. No HA, no vision change, no RUQ pain, good FM.  Vitals:   07/27/21 1035 07/27/21 1135 07/27/21 1223 07/27/21 1334  BP: 111/71 (!) 141/77 133/65 (!) 144/75  Pulse: 81 84 81 79  Resp:  16  17  Temp: 97.6 F (36.4 C)     TempSrc: Oral     SpO2:      Weight:      Height:      Gen: well appearing, no distress ABd: gravid, NT LE: NT, no edema GU: cvx 5/50%/ vtx -3/ intact/ soft/ post  Toco: not tracing well FH 140s, + accels, no decels, 10 beat var  Ala Dach 07/27/2021 2:01 PM

## 2021-07-27 NOTE — Progress Notes (Signed)
S: Comfortable. After discussion with RN and husband, desires to proceed with Foley balloon placement. Reviewed R/B/A of placement. Eagerly anticipating a baby boy with undecided name.   O: Vitals:   07/27/21 0400 07/27/21 0601 07/27/21 0829 07/27/21 0924  BP: (!) 117/53 (!) 111/55 131/76 139/78  Pulse: 91 90 90 93  Resp: 16 16 18 16   Temp: 98.1 F (36.7 C)     TempSrc: Oral     SpO2:      Weight: 116.1 kg     Height:       FHT:  FHR: 135 bpm, variability: moderate,  accelerations:  Present,  decelerations:  Absent UC:   irregular SVE:   Dilation: 2 Effacement (%): 70 Station: -3 Exam by:: A Swannie Milius CNM  Foley balloon placed without difficulty. 60cc of fluid instilled in balloon and taped to leg for traction. Patient tolerated procedure well.   A / P: Induction of labor due to gestational diabetes and cHTN and PAI-1, s/p Cytotec overnight, Foley balloon now in place  Fetal Wellbeing:  Category I Pain Control:  Labor support without medications Anticipated MOD:  NSVD  Will start Pitocin 2x2.   Dr. [email or web address] updated on patient status and plan of care.   Ernestina Penna, CNM, MSN 07/27/2021, 10:10 AM

## 2021-07-27 NOTE — Anesthesia Preprocedure Evaluation (Signed)
Anesthesia Evaluation  Patient identified by MRN, date of birth, ID band Patient awake    Reviewed: Allergy & Precautions, H&P , NPO status , Patient's Chart, lab work & pertinent test results, reviewed documented beta blocker date and time   Airway Mallampati: III  TM Distance: >3 FB Neck ROM: full    Dental no notable dental hx. (+) Teeth Intact, Dental Advisory Given   Pulmonary neg pulmonary ROS,    Pulmonary exam normal breath sounds clear to auscultation       Cardiovascular hypertension, Pt. on medications and Pt. on home beta blockers Normal cardiovascular exam Rhythm:regular Rate:Normal     Neuro/Psych negative neurological ROS  negative psych ROS   GI/Hepatic negative GI ROS, Neg liver ROS,   Endo/Other  diabetes, Gestational  Renal/GU negative Renal ROS  negative genitourinary   Musculoskeletal   Abdominal   Peds  Hematology negative hematology ROS (+)   Anesthesia Other Findings   Reproductive/Obstetrics (+) Pregnancy                             Anesthesia Physical Anesthesia Plan  ASA: 3  Anesthesia Plan: Epidural   Post-op Pain Management:    Induction:   PONV Risk Score and Plan: 2  Airway Management Planned: Natural Airway  Additional Equipment: None  Intra-op Plan:   Post-operative Plan:   Informed Consent: I have reviewed the patients History and Physical, chart, labs and discussed the procedure including the risks, benefits and alternatives for the proposed anesthesia with the patient or authorized representative who has indicated his/her understanding and acceptance.       Plan Discussed with: Anesthesiologist and CRNA  Anesthesia Plan Comments:         Anesthesia Quick Evaluation

## 2021-07-27 NOTE — Anesthesia Procedure Notes (Signed)
Epidural Patient location during procedure: OB Start time: 07/27/2021 7:07 PM End time: 07/27/2021 7:13 PM  Staffing Anesthesiologist: Bethena Midget, MD  Preanesthetic Checklist Completed: patient identified, IV checked, site marked, risks and benefits discussed, surgical consent, monitors and equipment checked, pre-op evaluation and timeout performed  Epidural Patient position: sitting Prep: DuraPrep and site prepped and draped Patient monitoring: continuous pulse ox and blood pressure Approach: midline Location: L4-L5 Injection technique: LOR air  Needle:  Needle type: Tuohy  Needle gauge: 17 G Needle length: 9 cm and 9 Needle insertion depth: 6 cm Catheter type: closed end flexible Catheter size: 19 Gauge Catheter at skin depth: 12 cm Test dose: negative  Assessment Events: blood not aspirated, injection not painful, no injection resistance, no paresthesia and negative IV test

## 2021-07-28 DIAGNOSIS — O10919 Unspecified pre-existing hypertension complicating pregnancy, unspecified trimester: Secondary | ICD-10-CM | POA: Diagnosis present

## 2021-07-28 DIAGNOSIS — Z1589 Genetic susceptibility to other disease: Secondary | ICD-10-CM

## 2021-07-28 DIAGNOSIS — F419 Anxiety disorder, unspecified: Secondary | ICD-10-CM | POA: Diagnosis present

## 2021-07-28 DIAGNOSIS — O24419 Gestational diabetes mellitus in pregnancy, unspecified control: Secondary | ICD-10-CM

## 2021-07-28 LAB — CBC
HCT: 33.6 % — ABNORMAL LOW (ref 36.0–46.0)
Hemoglobin: 11.5 g/dL — ABNORMAL LOW (ref 12.0–15.0)
MCH: 31.3 pg (ref 26.0–34.0)
MCHC: 34.2 g/dL (ref 30.0–36.0)
MCV: 91.3 fL (ref 80.0–100.0)
Platelets: 317 10*3/uL (ref 150–400)
RBC: 3.68 MIL/uL — ABNORMAL LOW (ref 3.87–5.11)
RDW: 13.3 % (ref 11.5–15.5)
WBC: 16.8 10*3/uL — ABNORMAL HIGH (ref 4.0–10.5)
nRBC: 0 % (ref 0.0–0.2)

## 2021-07-28 LAB — GLUCOSE, CAPILLARY
Glucose-Capillary: 112 mg/dL — ABNORMAL HIGH (ref 70–99)
Glucose-Capillary: 82 mg/dL (ref 70–99)

## 2021-07-28 MED ORDER — ACETAMINOPHEN 325 MG PO TABS
650.0000 mg | ORAL_TABLET | ORAL | Status: DC | PRN
  Administered 2021-07-28 – 2021-07-29 (×3): 650 mg via ORAL
  Filled 2021-07-28 (×3): qty 2

## 2021-07-28 MED ORDER — OXYCODONE HCL 5 MG PO TABS: 5.0000 mg | ORAL_TABLET | ORAL | Status: DC | PRN

## 2021-07-28 MED ORDER — SIMETHICONE 80 MG PO CHEW: 80.0000 mg | CHEWABLE_TABLET | ORAL | Status: DC | PRN

## 2021-07-28 MED ORDER — SENNOSIDES-DOCUSATE SODIUM 8.6-50 MG PO TABS
2.0000 | ORAL_TABLET | ORAL | Status: DC
  Filled 2021-07-28 (×3): qty 2

## 2021-07-28 MED ORDER — OXYCODONE HCL 5 MG PO TABS: 10.0000 mg | ORAL_TABLET | ORAL | Status: DC | PRN

## 2021-07-28 MED ORDER — BUSPIRONE HCL 5 MG PO TABS
7.5000 mg | ORAL_TABLET | Freq: Two times a day (BID) | ORAL | Status: DC
  Filled 2021-07-28 (×2): qty 2

## 2021-07-28 MED ORDER — ZOLPIDEM TARTRATE 5 MG PO TABS: 5.0000 mg | ORAL_TABLET | Freq: Every evening | ORAL | Status: DC | PRN

## 2021-07-28 MED ORDER — DIPHENHYDRAMINE HCL 25 MG PO CAPS: 25.0000 mg | ORAL_CAPSULE | Freq: Four times a day (QID) | ORAL | Status: DC | PRN

## 2021-07-28 MED ORDER — PRENATAL MULTIVITAMIN CH
1.0000 | ORAL_TABLET | Freq: Every day | ORAL | Status: DC
  Administered 2021-07-28 – 2021-07-29 (×2): 1 via ORAL
  Filled 2021-07-28 (×2): qty 1

## 2021-07-28 MED ORDER — WITCH HAZEL-GLYCERIN EX PADS
1.0000 "application " | MEDICATED_PAD | CUTANEOUS | Status: DC | PRN
  Administered 2021-07-28: 1 via TOPICAL

## 2021-07-28 MED ORDER — HEPARIN SODIUM (PORCINE) 5000 UNIT/ML IJ SOLN
5000.0000 [IU] | Freq: Two times a day (BID) | INTRAMUSCULAR | Status: DC
  Administered 2021-07-28 – 2021-07-29 (×2): 5000 [IU] via SUBCUTANEOUS
  Filled 2021-07-28 (×3): qty 1

## 2021-07-28 MED ORDER — TETANUS-DIPHTH-ACELL PERTUSSIS 5-2.5-18.5 LF-MCG/0.5 IM SUSY: 0.5000 mL | PREFILLED_SYRINGE | Freq: Once | INTRAMUSCULAR | Status: DC

## 2021-07-28 MED ORDER — IBUPROFEN 600 MG PO TABS
600.0000 mg | ORAL_TABLET | Freq: Four times a day (QID) | ORAL | Status: DC
  Administered 2021-07-28 – 2021-07-29 (×7): 600 mg via ORAL
  Filled 2021-07-28 (×7): qty 1

## 2021-07-28 MED ORDER — ONDANSETRON HCL 4 MG/2ML IJ SOLN: 4.0000 mg | INTRAMUSCULAR | Status: DC | PRN

## 2021-07-28 MED ORDER — DIBUCAINE (PERIANAL) 1 % EX OINT: 1.0000 "application " | TOPICAL_OINTMENT | CUTANEOUS | Status: DC | PRN

## 2021-07-28 MED ORDER — ONDANSETRON HCL 4 MG PO TABS: 4.0000 mg | ORAL_TABLET | ORAL | Status: DC | PRN

## 2021-07-28 MED ORDER — CALCIUM CARBONATE ANTACID 500 MG PO CHEW
1.0000 | CHEWABLE_TABLET | Freq: Three times a day (TID) | ORAL | Status: DC
  Administered 2021-07-28: 200 mg via ORAL
  Filled 2021-07-28 (×4): qty 1

## 2021-07-28 MED ORDER — LORATADINE 10 MG PO TABS
10.0000 mg | ORAL_TABLET | Freq: Every day | ORAL | Status: DC
  Administered 2021-07-28: 10 mg via ORAL
  Filled 2021-07-28 (×2): qty 1

## 2021-07-28 MED ORDER — LABETALOL HCL 200 MG PO TABS
200.0000 mg | ORAL_TABLET | Freq: Three times a day (TID) | ORAL | Status: DC
  Administered 2021-07-28 – 2021-07-29 (×5): 200 mg via ORAL
  Filled 2021-07-28 (×5): qty 1

## 2021-07-28 MED ORDER — COCONUT OIL OIL: 1.0000 "application " | TOPICAL_OIL | Status: DC | PRN

## 2021-07-28 MED ORDER — BENZOCAINE-MENTHOL 20-0.5 % EX AERO
1.0000 "application " | INHALATION_SPRAY | CUTANEOUS | Status: DC | PRN
  Administered 2021-07-28: 1 via TOPICAL
  Filled 2021-07-28: qty 56

## 2021-07-28 NOTE — Plan of Care (Signed)
  Problem: Life Cycle: Goal: Chance of risk for complications during the postpartum period will decrease Outcome: Progressing  Pt admitted to Mother Baby unit this shift. On frequent vitals and fundal checks. Pt stable and uterine tone acceptable thus far. Pt OOB to bathroom with assist. Pt reports feeling dizzy, but is steady on her feet. Instructed pt to call the next time she needs to get out of bed. Pt verbalizes understanding. BP's stable this shift. Assisted mother with breastfeeding and discussed plan to monitor baby's BG's. See note on Baby's chart for interventions. Pain well-controlled on ibuprofen. Continue to monitor

## 2021-07-28 NOTE — Plan of Care (Signed)
Patient had a spontaneous vaginal delivery of viable female at 2151. Recovery was routine. Care plan completed.  Fredderick Severance 12:38 AM

## 2021-07-28 NOTE — Progress Notes (Addendum)
Natasha Khan S/P NSVD  Live born female  Birth Weight: 8 lb 4.6 oz (3760 g) APGAR: 7, 9  Newborn Delivery   Birth date/time: [birth year]2021 21:51:00 Delivery type: Vaginal, Spontaneous     Baby name: Francesco Sor or Dexter  Delivering provider: Noland Fordyce   Episiotomy:None   Lacerations:1st degree;2nd degree   Circumcision Yes, planning  Feeding: breast  Pain control at delivery: Epidural   S:  Reports feeling well this morning. Reports some mild pain. Breastfeeding going well.              Tolerating PO/No nausea or vomiting             Bleeding is light             Pain controlled with acetaminophen and ibuprofen (OTC)             Up ad lib/ambulatory/voiding without difficulties   O:  A & O x 3, in no apparent distress  Vitals:   07/28/21 0110 07/28/21 0400 07/28/21 0510 07/28/21 0833  BP: 113/74 117/74 131/68 (!) 114/58  Pulse: 100 89 85 83  Resp: 18  16   Temp: 98.3 F (36.8 C)  98.3 F (36.8 C)   TempSrc: Oral  Oral   SpO2: 97%  97%   Weight:      Height:       Recent Labs    07/27/21 1734 07/28/21 0452  WBC 17.3* 16.8*  HGB 12.Khan 11.5*  HCT 36.7 33.6*  PLT 365 317    Blood type: --/--/B POS (12/06 0204)  Rubella: Nonimmune (05/24 0000)   I&O: I/O last 3 completed shifts: In: 0  Out: 550 [Urine:300; Blood:250]          No intake/output data recorded.  Vaccines: TDaP          Declined                    COVID-19 Declined   Gen: AAO x 3, NAD  Abdomen: soft, non-tender, non-distended            Fundus: firm, non-tender, U-Khan  Perineum: repair intact  Lochia: small  Extremities: no edema, no calf pain or tenderness   A/P:  Natasha Khan 35 y.o., [id number]  Principal Problem:   Postpartum care following vaginal delivery 12/7  Doing well - stable status  Routine post partum orders Active Problems:   Heterozygous for MTHFR gene mutation   Encounter for induction of labor   SVD 12/7   Chronic hypertension affecting pregnancy  BPs WNL this AM  Labs  stable  Continue Labetalol 200mg  TID   Gestational diabetes mellitus, class A2  Fasting blood sugar WNL this AM   Second degree perineal laceration  Discussed perineal care and comfort measures.    PAI-Khan  Continue heparin 5000U BID while inpatient then transition to Lovenox 40mg  at discharge for 12 weeks per Dr.   Anxiety  Continue Buspar 7.5mg   Close PP F/U for mood  Anticipate discharge tomorrow.   , MSN, CNM 07/28/2021, 9:18 AM

## 2021-07-28 NOTE — Lactation Note (Signed)
This note was copied from a baby's chart. Lactation Consultation Note  Patient Name: Natasha Khan Date: 07/28/2021 Reason for consult: Initial assessment;Difficult latch;Early term 37-38.6wks;Maternal endocrine disorder;1st time breastfeeding;Primapara GHTN Age:36 hours  LC in to visit with P1 Mom of ET infant.  Mom GDM on insulin during pregnancy.  Baby has had 2 low CBGs where baby was given gel and fed formula by bottle, 14 and 13 ml.    Mom reports that baby has latched and fed 3 times.  Mom reports no breast changes during pregnancy.  Left breast is smaller.  Both breasts have 1 discolored and slightly hard areas on them about 4 cm in diameter.  Mom reports having mastitis on right breast during pregnancy, and an abscess on the other before pregnancy, but is very unclear what was done by MD.  She has had ultrasounds and plans to have further evaluation done.  Due to supplementation and unstable blood sugars in baby, LC set up a DEBP with instructions on use and care.  Mom is too tired to start pumping, RN aware that Mom will need assistance.  24 mm flange appear to be a good size.    Mom shared that she is "ok with both bottle and breastfeeding".  Mom also doesn't think she has anything for baby.  LC reviewed hand expression and breast massage.  Unable to express any drops currently.   Encouraged STS with baby as much as she can.  Recommended pumping if baby is supplemented.  Reviewed paced bottle feeding.  Mom knows she can call prn for assistance.  Mom exhausted and wants to nap right now.   Maternal Data Has patient been taught Hand Expression?: Yes Does the patient have breastfeeding experience prior to this delivery?: No  Feeding Mother's Current Feeding Choice: Breast Milk and Formula Nipple Type: Slow - flow  LATCH Score Latch: Too sleepy or reluctant, no latch achieved, no sucking elicited.  Audible Swallowing: None  Type of Nipple: Everted at rest and after  stimulation  Comfort (Breast/Nipple): Soft / non-tender  Hold (Positioning): Full assist, staff holds infant at breast  LATCH Score: 4   Lactation Tools Discussed/Used Tools: Pump;Flanges;Bottle Flange Size: 24 Breast pump type: Double-Electric Breast Pump Pump Education: Setup, frequency, and cleaning;Milk Storage Reason for Pumping: Support milk supply/baby supplementing due to low CBG Pumping frequency: Encouraged to pump after breastfeeding  Interventions Interventions: Breast feeding basics reviewed;Assisted with latch;Skin to skin;Breast massage;Hand express;Pre-pump if needed;Support pillows;DEBP;LC Services brochure;Pace feeding  Discharge Pump: Personal (Spectra DEBP) WIC Program: No  Consult Status Consult Status: Follow-up Date: 07/29/21 Follow-up type: In-patient    Natasha Khan 07/28/2021, 10:23 AM

## 2021-07-28 NOTE — Progress Notes (Signed)
   07/28/21 0400  Vital Signs  BP 117/74  BP Location Right Arm  Patient Position (if appropriate) Lying  BP Method Automatic  Pulse Rate 89  Pt reports not feeling well, states she has continued headache, 4/10. BP is stable, pt denies seeing spots. No acute edema noted. Will continue to monitor.

## 2021-07-28 NOTE — Anesthesia Postprocedure Evaluation (Signed)
Anesthesia Post Note  Patient: Natasha Khan  Procedure(s) Performed: AN AD HOC LABOR EPIDURAL     Patient location during evaluation: Mother Baby Anesthesia Type: Epidural Level of consciousness: awake and alert Pain management: pain level controlled Vital Signs Assessment: post-procedure vital signs reviewed and stable Respiratory status: spontaneous breathing, nonlabored ventilation and respiratory function stable Cardiovascular status: stable Postop Assessment: no headache, no backache and epidural receding Anesthetic complications: no   No notable events documented.  Last Vitals:  Vitals:   07/28/21 0400 07/28/21 0510  BP: 117/74 131/68  Pulse: 89 85  Resp:  16  Temp:  36.8 C  SpO2:  97%    Last Pain:  Vitals:   07/28/21 0510  TempSrc: Oral  PainSc:    Pain Goal:                   [name]

## 2021-07-29 MED ORDER — BENZOCAINE-MENTHOL 20-0.5 % EX AERO
1.0000 | INHALATION_SPRAY | CUTANEOUS | Status: AC | PRN
Start: 2021-07-29 — End: ?

## 2021-07-29 MED ORDER — IBUPROFEN 600 MG PO TABS
600.0000 mg | ORAL_TABLET | Freq: Four times a day (QID) | ORAL | 0 refills | Status: AC
Start: 2021-07-29 — End: ?

## 2021-07-29 MED ORDER — LABETALOL HCL 200 MG PO TABS: 200.0000 mg | ORAL_TABLET | Freq: Three times a day (TID) | ORAL | 0 refills | Status: DC

## 2021-07-29 MED ORDER — ACETAMINOPHEN 500 MG PO TABS: 1000.0000 mg | ORAL_TABLET | Freq: Four times a day (QID) | ORAL | 2 refills | Status: AC | PRN

## 2021-07-29 MED ORDER — COCONUT OIL OIL: 1.0000 "application " | TOPICAL_OIL | 0 refills | Status: AC | PRN

## 2021-07-29 NOTE — Discharge Summary (Addendum)
OB Discharge Summary  Patient Name: Natasha Khan DOB: [birth year] MRN: [id number]  Date of admission: 07/27/2021 Delivering provider: Noland Fordyce   Admitting diagnosis: Encounter for induction of labor [Z34.90] Intrauterine pregnancy: [redacted]w[redacted]d     Secondary diagnosis: Patient Active Problem List   Diagnosis Date Noted   SVD 12/6 07/28/2021   Postpartum care following vaginal delivery 12/6 07/28/2021   Chronic hypertension affecting pregnancy 07/28/2021   Gestational diabetes mellitus, class A2 07/28/2021   Second degree perineal laceration 07/28/2021   PAI-1 4G/4G genotype 07/28/2021   Anxiety 07/28/2021   Encounter for induction of labor 07/27/2021   Heterozygous for MTHFR gene mutation 02/06/2018   Additional problems:none   Date of discharge: 07/29/2021   Discharge diagnosis: Principal Problem:   Postpartum care following vaginal delivery 12/6 Active Problems:   Heterozygous for MTHFR gene mutation   Encounter for induction of labor   SVD 12/6   Chronic hypertension affecting pregnancy   Gestational diabetes mellitus, class A2   Second degree perineal laceration   PAI-1 4G/4G genotype   Anxiety                                                              Post partum procedures: none  Augmentation: AROM, Pitocin, Cytotec, and IP Foley Pain control: Epidural  Laceration:1st degree;2nd degree  Episiotomy:None  Complications: None  Hospital course:  Induction of Labor With Vaginal Delivery   35 y.o. yo [id number] at [redacted]w[redacted]d was admitted to the hospital 07/27/2021 for induction of labor.  Indication for induction:  chronic hypertension .  Patient had an uncomplicated labor course as follows: Membrane Rupture Time/Date: 4:38 PM ,07/27/2021   Delivery Method:Vaginal, Spontaneous  Episiotomy: None  Lacerations:  1st degree;2nd degree  Details of delivery can be found in separate delivery note.  Patient had a routine postpartum course. Patient is discharged home  07/29/21.  Newborn Data: Birth date:07/27/2021  Birth time:9:51 PM  Gender:Female  Living status:Living  Apgars:7 ,9  Weight:3760 g   Physical exam  Vitals:   07/28/21 1700 07/28/21 2131 07/29/21 0511 07/29/21 0918  BP: 116/72 122/68 123/74 126/89  Pulse:  91 81 85  Resp: 16 18 16    Temp: 98.2 F (36.8 C) 98.1 F (36.7 C) 98.1 F (36.7 C)   TempSrc: Oral Oral Oral   SpO2: 99% 97% 98%   Weight:      Height:       General: alert, cooperative, and no distress Lochia: appropriate Uterine Fundus: firm Incision: N/A Perineum: repair intact, no edema DVT Evaluation: No cords or calf tenderness. No significant calf/ankle edema. Labs: Lab Results  Component Value Date   WBC 16.8 (H) 07/28/2021   HGB 11.5 (L) 07/28/2021   HCT 33.6 (L) 07/28/2021   MCV 91.3 07/28/2021   PLT 317 07/28/2021   CMP Latest Ref Rng & Units 07/27/2021  Glucose 70 - 99 mg/dL 95  BUN 6 - 20 mg/dL 8  Creatinine 14/01/2021 - 9.32 mg/dL 6.71  Sodium 2.45 - 809 mmol/L 133(L)  Potassium 3.5 - 5.1 mmol/L 3.9  Chloride 98 - 111 mmol/L 105  CO2 22 - 32 mmol/L 20(L)  Calcium 8.9 - 10.3 mg/dL 8.9  Total Protein 6.5 - 8.1 g/dL 6.1(L)  Total Bilirubin 0.3 - 1.2 mg/dL 0.3  Alkaline Phos  38 - 126 U/L 72  AST 15 - 41 U/L 31  ALT 0 - 44 U/L 20   No flowsheet data found. Vaccines: TDaP          declined        COVID-19   declined        Flu             declined  Discharge instruction:  per After Visit Summary,  [facility] booklet and  "[facility] Baby [facility]" hospital booklet  After Visit Meds:  Allergies as of 07/29/2021   No Known Allergies      Medication List     STOP taking these medications    aspirin EC 81 MG tablet       TAKE these medications    acetaminophen 500 MG tablet Commonly known as: TYLENOL Take 2 tablets (1,000 mg total) by mouth every 6 (six) hours as needed.   benzocaine-Menthol 20-0.5 % Aero Commonly known as: DERMOPLAST Apply 1 application topically as needed  for irritation (perineal discomfort).   busPIRone 7.5 MG tablet Commonly known as: BUSPAR Take 1 tablet (7.5 mg total) by mouth 2 (two) times daily.   CAL-MAG-ZINC-D PO Take 3 tablets by mouth daily.   calcium carbonate 500 MG chewable tablet Commonly known as: TUMS - dosed in mg elemental calcium Chew 1-2 tablets by mouth daily as needed for indigestion or heartburn.   coconut oil Oil Apply 1 application topically as needed.   enoxaparin 40 MG/0.4ML injection Commonly known as: LOVENOX Inject 40 mg into the skin daily. Last dose 07-13-21   HM SUPER VITAMIN B COMPLEX/C PO Take 1 tablet by mouth daily.   ibuprofen 600 MG tablet Commonly known as: ADVIL Take 1 tablet (600 mg total) by mouth every 6 (six) hours.   labetalol 200 MG tablet Commonly known as: NORMODYNE Take 1 tablet (200 mg total) by mouth 3 (three) times daily for 30 doses. What changed:  how much to take Another medication with the same name was removed. Continue taking this medication, and follow the directions you see here.   loratadine 10 MG tablet Commonly known as: CLARITIN Take 10 mg by mouth daily as needed for allergies.   multivitamin-prenatal 27-0.8 MG Tabs tablet Take 1 tablet by mouth daily at 12 noon.   omeprazole 20 MG tablet Commonly known as: PRILOSEC OTC Take 20 mg by mouth daily as needed (Heartburn).               Discharge Care Instructions  (From admission, onward)           Start     Ordered   07/29/21 0000  Discharge wound care:       Comments: Sitz baths 2 times /day with warm water x 1 week. May add herbals: 1 ounce dried comfrey leaf* 1 ounce calendula flowers 1 ounce lavender flowers  Supplies can be found online at [employer] sources at [employer], [employer]  1/2 ounce dried uva ursi leaves 1/2 ounce witch hazel blossoms (if you can find them) 1/2 ounce dried sage leaf 1/2 cup sea salt Directions: Bring 2 quarts of water to a boil. Turn  off heat, and place 1 ounce (approximately 1 large handful) of the above mixed herbs (not the salt) into the pot. Steep, covered, for 30 minutes.  Strain the liquid well with a fine mesh strainer, and discard the herb material. Add 2 quarts of liquid to the tub,  along with the 1/2 cup of salt. This medicinal liquid can also be made into compresses and peri-rinses.   07/29/21 1107            Diet: routine diet  Activity: Advance as tolerated. Pelvic rest for 6 weeks.   Postpartum contraception: TBA in office  Newborn Data: Live born female  Birth Weight: 8 lb 4.6 oz (3760 g) APGAR: 7, 9  Newborn Delivery   Birth date/time: 07/27/2021 21:51:00 Delivery type: Vaginal, Spontaneous      named Francesco Sor Baby Feeding: Breast Disposition:home with mother Circumcision: inpatient, Dr. Juliene Pina  Delivery Report:  Review the Delivery Report for details.    Follow up:  Follow-up Information     Noland Fordyce, MD. Go in 1 week(s).   Specialty: Obstetrics and Gynecology Why: For Postpartum follow-up Contact information: [address] [place] [address] [phone]                   Signed: Neta Mends, CNM, MSN 07/29/2021, 11:08 AM

## 2021-07-29 NOTE — Lactation Note (Signed)
This note was copied from a baby's chart. Lactation Consultation Note  Patient Name: Natasha Khan [id number] Date: 07/29/2021 Reason for consult: Follow-up assessment;Early term 37-38.6wks;Primapara;1st time breastfeeding Age:36 hours   P1 mother whose infant is now 25 hours old.  This is an ETI at 38+3 weeks.  Mother's current feeding preference is breast/formula.  Baby has had hypoglycemia and last level was 46 mg/dl early this morning.  Baby was asleep on father's chest when I arrived.  He has been primarily formula feeding with adequate volumes.  Mother is unable to express colostrum at this time but has been pumping with the DEBP.  Encouraged to continue latching prior to giving any formula supplementation.  Baby is voiding/stooling well.  Parents informed me that he will be getting a circumcision and another blood sugar drawn today.  They are hoping for a discharge later today.     Maternal Data Has patient been taught Hand Expression?: Yes Does the patient have breastfeeding experience prior to this delivery?: No  Feeding Mother's Current Feeding Choice: Breast Milk and Formula Nipple Type: Slow - flow  LATCH Score                    Lactation Tools Discussed/Used Tools: Pump Flange Size: 21 Breast pump type: Double-Electric Breast Pump;Manual Pump Education:  (No review desired) Reason for Pumping: Breast stimulation for supplementation Pumping frequency: Every three hours  Interventions Interventions: Education  Discharge Pump: DEBP;Manual;Personal WIC Program: No  Consult Status Consult Status: Follow-up Date: 07/30/21 Follow-up type: In-patient    Dora Sims 07/29/2021, 12:57 PM

## 2021-07-29 NOTE — Discharge Instructions (Signed)
Lactation outpatient support - home visit ° ° °Jessica Bowers, IBCLC (lactation consultant)  & Birth Doula ° °Phone (text or call): [phone] °Email: [email or web address] °[email or web address] ° ° °Linda Coppola °RN, MHA, IBCLC °at [facility]: Lactation Consultant ° °[email or web address] °Mail: [email or web address] °Tel: [phone] ° ° °Additional breastfeeding resources: ° °[facility] °https://ibconline.ca/information-sheets/ ° °[employer] [facility] [place] ° °www.lllofnc.org ° ° °Other Resources: ° °Chiropractic specialist  ° °Dr. Leanna Hastings °[email or web address] ° ° °Craniosacral therapy for baby ° °Erin Balkind  °[email or web address] ° °

## 2021-08-01 ENCOUNTER — Inpatient Hospital Stay (HOSPITAL_COMMUNITY)
Admission: AD | Admit: 2021-08-01 | Discharge: 2021-08-01 | Disposition: A | Discharge status: Home / Self Care | Payer: BC Managed Care – PPO | Attending: Obstetrics | Admitting: Obstetrics

## 2021-08-01 ENCOUNTER — Other Ambulatory Visit: Payer: Self-pay

## 2021-08-01 ENCOUNTER — Encounter (HOSPITAL_COMMUNITY): Payer: Self-pay | Admitting: Obstetrics

## 2021-08-01 DIAGNOSIS — Z79899 Other long term (current) drug therapy: Secondary | ICD-10-CM | POA: Diagnosis not present

## 2021-08-01 DIAGNOSIS — O9089 Other complications of the puerperium, not elsewhere classified: Secondary | ICD-10-CM | POA: Diagnosis not present

## 2021-08-01 DIAGNOSIS — R5383 Other fatigue: Secondary | ICD-10-CM | POA: Insufficient documentation

## 2021-08-01 DIAGNOSIS — R519 Headache, unspecified: Secondary | ICD-10-CM | POA: Insufficient documentation

## 2021-08-01 DIAGNOSIS — O165 Unspecified maternal hypertension, complicating the puerperium: Secondary | ICD-10-CM | POA: Diagnosis not present

## 2021-08-01 DIAGNOSIS — O1093 Unspecified pre-existing hypertension complicating the puerperium: Secondary | ICD-10-CM | POA: Insufficient documentation

## 2021-08-01 LAB — CBC
HCT: 39.1 % (ref 36.0–46.0)
Hemoglobin: 12.9 g/dL (ref 12.0–15.0)
MCH: 30.8 pg (ref 26.0–34.0)
MCHC: 33 g/dL (ref 30.0–36.0)
MCV: 93.3 fL (ref 80.0–100.0)
Platelets: 401 10*3/uL — ABNORMAL HIGH (ref 150–400)
RBC: 4.19 MIL/uL (ref 3.87–5.11)
RDW: 12.6 % (ref 11.5–15.5)
WBC: 8.1 10*3/uL (ref 4.0–10.5)
nRBC: 0 % (ref 0.0–0.2)

## 2021-08-01 LAB — COMPREHENSIVE METABOLIC PANEL
ALT: 32 U/L (ref 0–44)
AST: 27 U/L (ref 15–41)
Albumin: 3.2 g/dL — ABNORMAL LOW (ref 3.5–5.0)
Alkaline Phosphatase: 66 U/L (ref 38–126)
Anion gap: 8 (ref 5–15)
BUN: 12 mg/dL (ref 6–20)
CO2: 25 mmol/L (ref 22–32)
Calcium: 9.3 mg/dL (ref 8.9–10.3)
Chloride: 102 mmol/L (ref 98–111)
Creatinine, Ser: 0.77 mg/dL (ref 0.44–1.00)
GFR, Estimated: >60 mL/min (ref >60)
Glucose, Bld: 81 mg/dL (ref 70–99)
Potassium: 3.8 mmol/L (ref 3.5–5.1)
Sodium: 135 mmol/L (ref 135–145)
Total Bilirubin: 0.4 mg/dL (ref 0.3–1.2)
Total Protein: 6.7 g/dL (ref 6.5–8.1)

## 2021-08-01 MED ORDER — LABETALOL HCL 200 MG PO TABS
400.0000 mg | ORAL_TABLET | Freq: Three times a day (TID) | ORAL | 0 refills | Status: AC
Start: 2021-08-01 — End: 2021-08-11

## 2021-08-01 MED ORDER — CYCLOBENZAPRINE HCL 10 MG PO TABS: 10.0000 mg | ORAL_TABLET | Freq: Two times a day (BID) | ORAL | 0 refills | Status: AC | PRN

## 2021-08-01 NOTE — Discharge Instructions (Addendum)
You may take Flexeril 10 mg as needed up to twice daily for relief of tension headache. It will make you drowsy, so exercise precaution while taking it. It is safe to take this medication while breastfeeding. You can also take Tylenol with it, if it does not completely resolve headache.

## 2021-08-01 NOTE — MAU Provider Note (Signed)
History     CSN: [id number]  Arrival date and time: 08/01/21 1954   Event Date/Time   First Provider Initiated Contact with Patient 08/01/21 2049      Chief Complaint  Patient presents with   Headache   BP Evaluation   Natasha Khan is a 35 y.o. year old [id number] female at  5 days s/p SVD who presents to MAU reporting cHTN, H/A not relieved with Tylenol and intermittent blurry vision. Her BPs at home were 163/99 and 177/105. She takes Labetalol 200 mg TID; has taken all 3 doses today. She reports taking Tylenol 500 mg once today; unsure of the time. She describes the H/A as a dull ache; rated 4/10. She denies needing to take anything right now for the H/A. She denies any epigastric pain. She has trace edema in BLE. Her high risk pregnancy is complicated by cHTN, obesity, h/o SAB, and h/o of thrombosis. Her OB provider is [facility]; Dr. Ernestina Penna. Her spouse is present and contributing to the history taking.     OB History     Gravida  3   Para  1   Term  1   Preterm      AB  2   Living  1      SAB  2   IAB      Ectopic      Multiple  0   Live Births  1           Past Medical History:  Diagnosis Date   Diabetes mellitus without complication (HCC)    Hypertension    Pregnancy induced hypertension     Past Surgical History:  Procedure Laterality Date   CERVICAL POLYPECTOMY     EYE SURGERY      Family History  Problem Relation Age of Onset   Hypertension Mother    Hypertension Father    Heart attack Father    Colon cancer Maternal Grandmother    Diabetes Maternal Grandfather    Prostate cancer Maternal Grandfather    Breast cancer Paternal Grandmother    Heart disease Paternal Grandfather     Social History   Tobacco Use   Smoking status: Never    Passive exposure: Never   Smokeless tobacco: Never  Vaping Use   Vaping Use: Never used  Substance Use Topics   Alcohol use: Never   Drug use: Never    Allergies: No Known  Allergies  Medications Prior to Admission  Medication Sig Dispense Refill Last Dose   acetaminophen (TYLENOL) 500 MG tablet Take 2 tablets (1,000 mg total) by mouth every 6 (six) hours as needed. 100 tablet 2 08/01/2021   enoxaparin (LOVENOX) 40 MG/0.4ML injection Inject 40 mg into the skin daily. Last dose 07-13-21   08/01/2021   labetalol (NORMODYNE) 200 MG tablet Take 1 tablet (200 mg total) by mouth 3 (three) times daily for 30 doses. 30 tablet 0 08/01/2021 at 1800   B Complex-C-Folic Acid (HM SUPER VITAMIN B COMPLEX/C PO) Take 1 tablet by mouth daily.      benzocaine-Menthol (DERMOPLAST) 20-0.5 % AERO Apply 1 application topically as needed for irritation (perineal discomfort).      busPIRone (BUSPAR) 7.5 MG tablet Take 1 tablet (7.5 mg total) by mouth 2 (two) times daily.      calcium carbonate (TUMS - DOSED IN MG ELEMENTAL CALCIUM) 500 MG chewable tablet Chew 1-2 tablets by mouth daily as needed for indigestion or heartburn.  coconut oil OIL Apply 1 application topically as needed.  0    ibuprofen (ADVIL) 600 MG tablet Take 1 tablet (600 mg total) by mouth every 6 (six) hours. 30 tablet 0    loratadine (CLARITIN) 10 MG tablet Take 10 mg by mouth daily as needed for allergies.      Multiple Minerals-Vitamins (CAL-MAG-ZINC-D PO) Take 3 tablets by mouth daily.      omeprazole (PRILOSEC OTC) 20 MG tablet Take 20 mg by mouth daily as needed (Heartburn).      Prenatal Vit-Fe Fumarate-FA (MULTIVITAMIN-PRENATAL) 27-0.8 MG TABS tablet Take 1 tablet by mouth daily at 12 noon.       Review of Systems  Constitutional: Negative.   HENT: Negative.    Eyes: Negative.   Respiratory: Negative.    Cardiovascular:  Positive for leg swelling (small amount in ankles).  Gastrointestinal: Negative.   Endocrine: Negative.   Genitourinary: Negative.   Musculoskeletal: Negative.   Skin: Negative.   Allergic/Immunologic: Negative.   Neurological:  Positive for headaches (dull, all over head).   Hematological: Negative.   Psychiatric/Behavioral: Negative.    Physical Exam   Patient Vitals for the past 24 hrs:  BP Temp Temp src Pulse Resp SpO2 Height Weight  08/01/21 2130 (!) 151/76 -- -- 78 -- -- -- --  08/01/21 2115 133/78 -- -- 73 -- -- -- --  08/01/21 2100 (!) 148/79 -- -- 77 -- -- -- --  08/01/21 2046 (!) 137/98 -- -- 79 -- -- -- --  08/01/21 2027 (!) 145/90 -- -- 75 -- -- -- --  08/01/21 2008 (!) 165/88 97.7 F (36.5 C) Oral 72 18 98 % 5\' 8"  (1.727 m) 109 kg    Physical Exam Vitals and nursing note reviewed.  Constitutional:      Appearance: Normal appearance. She is obese.  HENT:     Head: Normocephalic and atraumatic.  Cardiovascular:     Rate and Rhythm: Normal rate.  Pulmonary:     Effort: Pulmonary effort is normal.  Abdominal:     Palpations: Abdomen is soft.  Genitourinary:    Comments: Not indicated Musculoskeletal:     Cervical back: Normal range of motion.  Skin:    Capillary Refill: Capillary refill takes more than 3 seconds.  Neurological:     General: No focal deficit present.     Mental Status: She is alert and oriented to person, place, and time.  Psychiatric:        Mood and Affect: Mood normal.        Behavior: Behavior normal.        Thought Content: Thought content normal.        Judgment: Judgment normal.    MAU Course  Procedures  MDM Saline Lock CBC CMP Serial BP's  Offered Tylenol >>patient declined  Results for orders placed or performed during the hospital encounter of 08/01/21 (from the past 24 hour(s))  CBC     Status: Abnormal   Collection Time: 08/01/21  8:16 PM  Result Value Ref Range   WBC 8.1 4.0 - 10.5 K/uL   RBC 4.19 3.87 - 5.11 MIL/uL   Hemoglobin 12.9 12.0 - 15.0 g/dL   HCT 14/11/22 56.2 - 13.0 %   MCV 93.3 80.0 - 100.0 fL   MCH 30.8 26.0 - 34.0 pg   MCHC 33.0 30.0 - 36.0 g/dL   RDW 86.5 78.4 - 69.6 %   Platelets 401 (H) 150 - 400 K/uL   nRBC 0.0 0.0 -  0.2 %  Comprehensive metabolic panel     Status:  Abnormal   Collection Time: 08/01/21  8:16 PM  Result Value Ref Range   Sodium 135 135 - 145 mmol/L   Potassium 3.8 3.5 - 5.1 mmol/L   Chloride 102 98 - 111 mmol/L   CO2 25 22 - 32 mmol/L   Glucose, Bld 81 70 - 99 mg/dL   BUN 12 6 - 20 mg/dL   Creatinine, Ser 7.37 0.44 - 1.00 mg/dL   Calcium 9.3 8.9 - 10.6 mg/dL   Total Protein 6.7 6.5 - 8.1 g/dL   Albumin 3.2 (L) 3.5 - 5.0 g/dL   AST 27 15 - 41 U/L   ALT 32 0 - 44 U/L   Alkaline Phosphatase 66 38 - 126 U/L   Total Bilirubin 0.4 0.3 - 1.2 mg/dL   GFR, Estimated >26 >94 mL/min   Anion gap 8 5 - 15     *Consult with Dr. Despina Hidden @ 2137 - notified of patient's complaints, assessments, lab & BP results, tx plan d/c home with scheduled appts at [facility] - ok to d/c home, agrees with plan --> Dr. Ernestina Penna present with Dr. Despina Hidden at the time of TC - advised to increase patient's Labetalol dosing to 400 mg TID.   Assessment and Plan  Hypertension, postpartum condition or complication  - Increase Labetalol dosing to 400 mg TID - Rx for change in dosing sent to pharmacy - Information provided on postpartum hypertension   Other fatigue - Recommend taking shifts with husband to increase amount of sleep for each  Postpartum headache  - Rx for Flexeril 10 mg BID prn H/A - Continue Tylenol [phone] mg prn H/A; can take with Flexeril  - Discharge patient - Keep scheduled appt with [facility] on 08/04/21 - Patient verbalized an understanding of the plan of care and agrees.    Raelyn Mora, CNM 08/01/2021, 9:36 PM

## 2021-08-01 NOTE — MAU Note (Signed)
Patient unsure of what time she took the Tylenol. She states it was within the last six hours. She has also had a total of 600 mg of Labetalol today. 200 mg this morning, 200 mg at 1400, and another 200 mg per her provider at 1800.

## 2021-08-01 NOTE — MAU Note (Signed)
Presents for BP evaluation.  Reports has Hx of CHTN and GHTN and takes Labetalol 200 mg, last taken @ 1800.  Endorses current H/A, unrelieved with Tylenol.  Denies epigastric pain, but endorses intermittent blurred vision.  Reports took BP @ home, BP's 163/99 & 177/105. S/P NVD 07/27/2021.

## 2021-08-10 ENCOUNTER — Telehealth (HOSPITAL_COMMUNITY): Payer: Self-pay | Admitting: *Deleted

## 2021-08-10 NOTE — Telephone Encounter (Signed)
Attempted hospital discharge follow-up call. Left message for patient to return RN call. Deforest Hoyles, RN, 08/10/21, [phone]

## 2022-10-17 IMAGING — US US BREAST*R* LIMITED INC AXILLA
1 series · 4 of 4 positions shown · non-contrast
Comparison: Previous exam(s).

CLINICAL DATA: Patient presents for diagnostic right breast
ultrasound due to approximate 1 week history of erythema warmth and
tenderness over the medial right periareolar region with associated
palpable abnormality. Patient started on Keflex 4 days ago with
mild-to-moderate improvement. Clinical concern for infection with
possible abscess. Patient is 14 weeks pregnant. History of left
breast abscess May 2020. Patient not diabetic. Patient quit
smoking one year ago.

EXAM:
ULTRASOUND OF THE RIGHT BREAST.

[Series 1: us breast*right* limited inc axilla · 0.06mm/px · 4 of 4 slices shown]
[im 1/4]
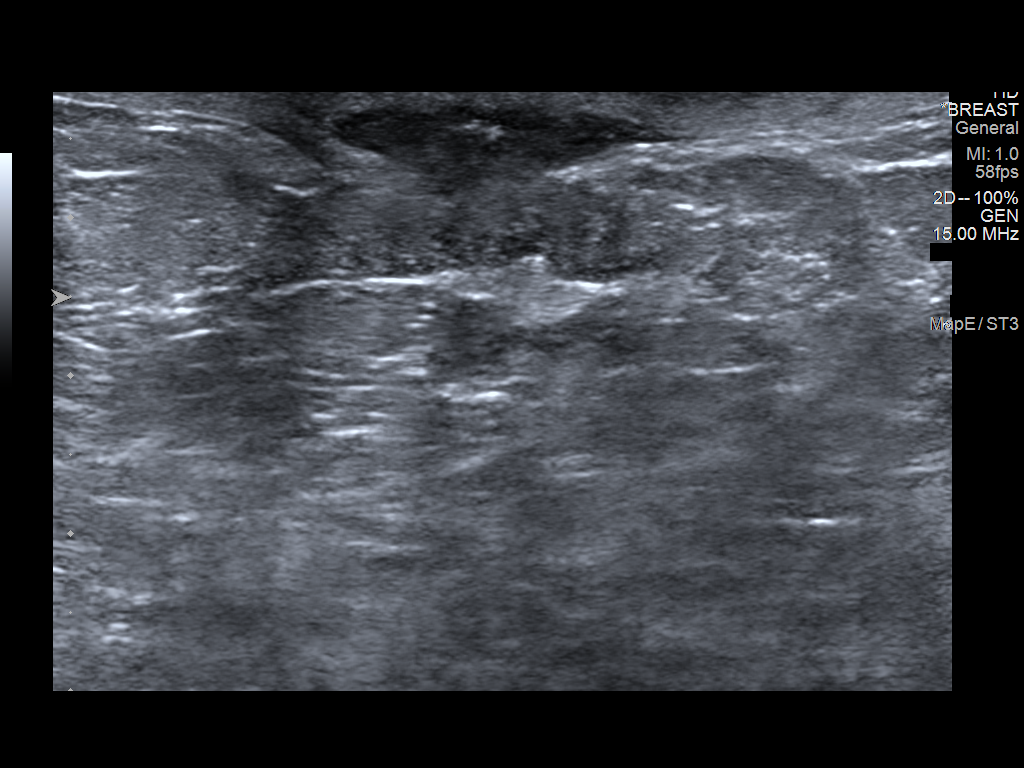
[im 2/4]
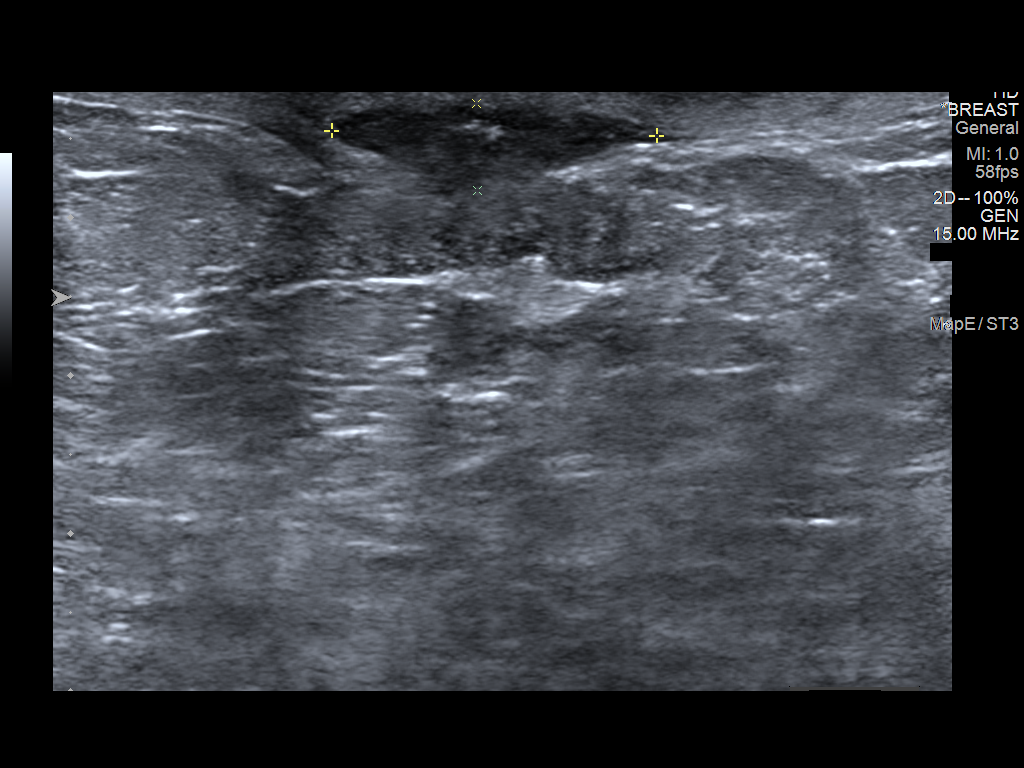
[im 3/4]
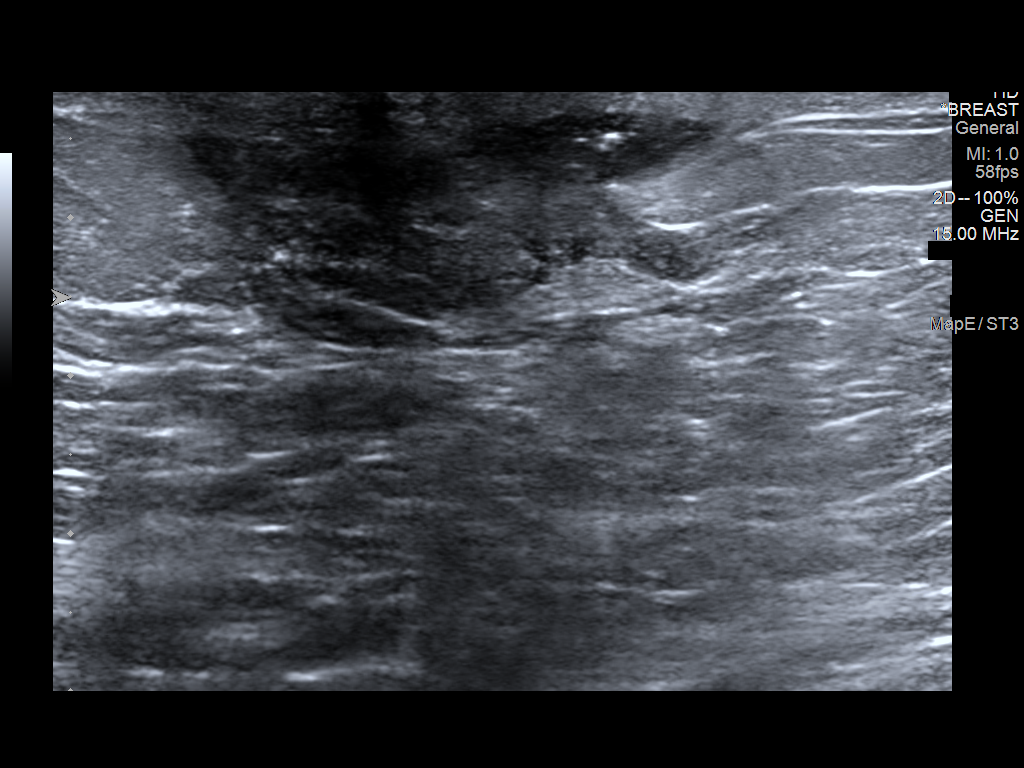
[im 4/4]
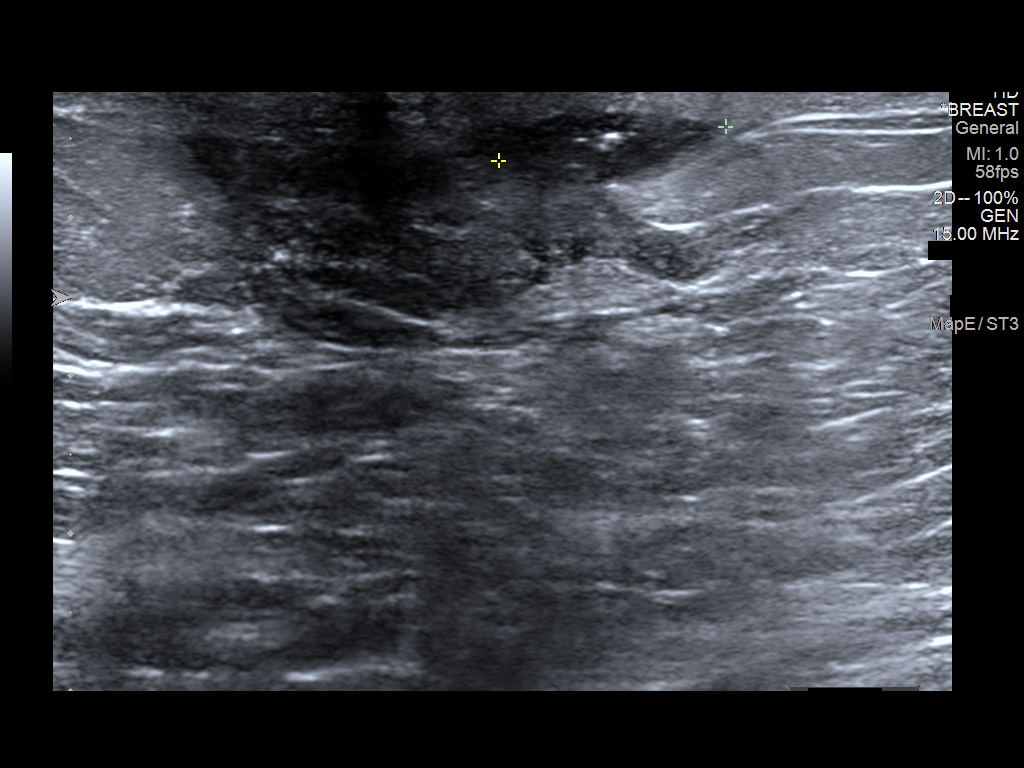

[4 of 4 positions shown; findings below may reference images not displayed]

FINDINGS: On physical exam, there is a 2 x 2 cm area of erythema over the
medial right periareolar region with 1.5 x 1.5 cm firm palpable area
over the medial right retroareolar region.

Targeted ultrasound is performed, showing skin thickening over the
medial right periareolar region with a focal oval complicated fluid
collection within this thickened skin measuring 0.6 x 1.5 x 2.1 cm
compatible with superficial infection. No evidence of extension into
the deeper breast tissue from this superficial collection.
IMPRESSION: Superficial 2.1 cm complicated fluid collection within the skin over
the 3 o'clock position of the right periareolar region compatible
with infection. No evidence of underlying abscess within the deeper
breast tissue.

RECOMMENDATION:
Recommend completion of patient's 10 day course of Keflex. If this
presumed infection clinically worsens, repeat ultrasound may be
warranted to evaluate for interval development of abscess as well as
possible in the upper second round of antibiotics. If clinical
resolution, no further follow-up needed.

I have discussed the findings and recommendations with the patient.
If applicable, a reminder letter will be sent to the patient
regarding the next appointment.

BI-RADS CATEGORY  2: Benign.
